# Patient Record
Sex: Male | Born: 1950 | Race: Black or African American | Hispanic: No | Smoking: Current every day smoker
Health system: Southern US, Community
[De-identification: ages and names within clinical notes are randomized; demographics above are authoritative.]

## PROBLEM LIST (undated history)

## (undated) DIAGNOSIS — I82409 Acute embolism and thrombosis of unspecified deep veins of unspecified lower extremity: Secondary | ICD-10-CM

## (undated) DIAGNOSIS — M109 Gout, unspecified: Secondary | ICD-10-CM

## (undated) DIAGNOSIS — I10 Essential (primary) hypertension: Secondary | ICD-10-CM

## (undated) HISTORY — DX: Gout, unspecified: M10.9

## (undated) HISTORY — DX: Essential (primary) hypertension: I10

## (undated) HISTORY — DX: Acute embolism and thrombosis of unspecified deep veins of unspecified lower extremity: I82.409

---

## 1978-06-26 HISTORY — PX: APPENDECTOMY: SHX54

## 2010-07-04 DIAGNOSIS — I80202 Phlebitis and thrombophlebitis of unspecified deep vessels of left lower extremity: Secondary | ICD-10-CM | POA: Insufficient documentation

## 2010-07-05 ENCOUNTER — Ambulatory Visit (HOSPITAL_COMMUNITY)
Admission: RE | Admit: 2010-07-05 | Discharge: 2010-07-05 | Payer: Self-pay | Source: Home / Self Care | Attending: Internal Medicine | Admitting: Internal Medicine

## 2010-07-05 ENCOUNTER — Inpatient Hospital Stay (HOSPITAL_COMMUNITY)
Admission: AD | Admit: 2010-07-05 | Discharge: 2010-07-07 | Payer: Self-pay | Source: Home / Self Care | Attending: Internal Medicine | Admitting: Internal Medicine

## 2010-07-11 LAB — CBC
HCT: 42.7 % (ref 39.0–52.0)
HCT: 40.6 % (ref 39.0–52.0)
HCT: 39.9 % (ref 39.0–52.0)
Hemoglobin: 15.2 g/dL (ref 13.0–17.0)
Hemoglobin: 14.5 g/dL (ref 13.0–17.0)
Hemoglobin: 14 g/dL (ref 13.0–17.0)
MCH: 34.2 pg — ABNORMAL HIGH (ref 26.0–34.0)
MCH: 34.3 pg — ABNORMAL HIGH (ref 26.0–34.0)
MCH: 33.7 pg (ref 26.0–34.0)
MCHC: 35.6 g/dL (ref 30.0–36.0)
MCHC: 35.7 g/dL (ref 30.0–36.0)
MCHC: 35.1 g/dL (ref 30.0–36.0)
MCV: 96 fL (ref 78.0–100.0)
MCV: 96 fL (ref 78.0–100.0)
Platelets: 187 10*3/uL (ref 150–400)
Platelets: 208 10*3/uL (ref 150–400)
RBC: 4.45 MIL/uL (ref 4.22–5.81)
RBC: 4.23 MIL/uL (ref 4.22–5.81)
RBC: 4.16 MIL/uL — ABNORMAL LOW (ref 4.22–5.81)
RDW: 13.8 % (ref 11.5–15.5)
RDW: 13.7 % (ref 11.5–15.5)
RDW: 13.4 % (ref 11.5–15.5)
WBC: 5.3 10*3/uL (ref 4.0–10.5)
WBC: 4.3 10*3/uL (ref 4.0–10.5)

## 2010-07-11 LAB — COMPREHENSIVE METABOLIC PANEL
ALT: 19 U/L (ref 0–53)
AST: 42 U/L — ABNORMAL HIGH (ref 0–37)
Albumin: 3.8 g/dL (ref 3.5–5.2)
Alkaline Phosphatase: 67 U/L (ref 39–117)
BUN: 12 mg/dL (ref 6–23)
CO2: 26 mEq/L (ref 19–32)
Calcium: 10.1 mg/dL (ref 8.4–10.5)
Chloride: 104 mEq/L (ref 96–112)
Creatinine, Ser: 1.26 mg/dL (ref 0.4–1.5)
GFR calc Af Amer: >60 mL/min (ref >60)
GFR calc non Af Amer: 59 mL/min — ABNORMAL LOW (ref >60)
Glucose, Bld: 106 mg/dL — ABNORMAL HIGH (ref 70–99)
Potassium: 3.2 mEq/L — ABNORMAL LOW (ref 3.5–5.1)
Sodium: 139 mEq/L (ref 135–145)
Total Bilirubin: 0.6 mg/dL (ref 0.3–1.2)
Total Protein: 7.4 g/dL (ref 6.0–8.3)

## 2010-07-11 LAB — URINALYSIS, ROUTINE W REFLEX MICROSCOPIC
Bilirubin Urine: NEGATIVE
Hgb urine dipstick: NEGATIVE
Ketones, ur: NEGATIVE mg/dL
Nitrite: NEGATIVE
Protein, ur: NEGATIVE mg/dL
Specific Gravity, Urine: 1.015 (ref 1.005–1.030)
Urine Glucose, Fasting: NEGATIVE mg/dL
Urobilinogen, UA: 0.2 mg/dL (ref 0.0–1.0)
pH: 6.5 (ref 5.0–8.0)

## 2010-07-11 LAB — DIFFERENTIAL
Basophils Absolute: 0 10*3/uL (ref 0.0–0.1)
Basophils Absolute: 0 10*3/uL (ref 0.0–0.1)
Basophils Absolute: 0 10*3/uL (ref 0.0–0.1)
Basophils Relative: 1 % (ref 0–1)
Basophils Relative: 1 % (ref 0–1)
Basophils Relative: 1 % (ref 0–1)
Eosinophils Absolute: 0.2 10*3/uL (ref 0.0–0.7)
Eosinophils Absolute: 0.3 10*3/uL (ref 0.0–0.7)
Eosinophils Absolute: 0.2 10*3/uL (ref 0.0–0.7)
Eosinophils Relative: 4 % (ref 0–5)
Eosinophils Relative: 6 % — ABNORMAL HIGH (ref 0–5)
Eosinophils Relative: 6 % — ABNORMAL HIGH (ref 0–5)
Lymphocytes Relative: 21 % (ref 12–46)
Lymphocytes Relative: 38 % (ref 12–46)
Lymphocytes Relative: 38 % (ref 12–46)
Lymphs Abs: 1.1 10*3/uL (ref 0.7–4.0)
Lymphs Abs: 1.6 10*3/uL (ref 0.7–4.0)
Lymphs Abs: 1.4 10*3/uL (ref 0.7–4.0)
Monocytes Absolute: 0.5 10*3/uL (ref 0.1–1.0)
Monocytes Absolute: 0.4 10*3/uL (ref 0.1–1.0)
Monocytes Absolute: 0.5 10*3/uL (ref 0.1–1.0)
Monocytes Relative: 9 % (ref 3–12)
Monocytes Relative: 10 % (ref 3–12)
Monocytes Relative: 14 % — ABNORMAL HIGH (ref 3–12)
Neutro Abs: 3.4 10*3/uL (ref 1.7–7.7)
Neutro Abs: 2 10*3/uL (ref 1.7–7.7)
Neutro Abs: 1.6 10*3/uL — ABNORMAL LOW (ref 1.7–7.7)
Neutrophils Relative %: 65 % (ref 43–77)
Neutrophils Relative %: 46 % (ref 43–77)
Neutrophils Relative %: 42 % — ABNORMAL LOW (ref 43–77)

## 2010-07-11 LAB — PROTIME-INR
INR: 0.97 (ref 0.00–1.49)
INR: 1.01 (ref 0.00–1.49)
INR: 1 (ref 0.00–1.49)
Prothrombin Time: 13.1 seconds (ref 11.6–15.2)
Prothrombin Time: 13.5 seconds (ref 11.6–15.2)
Prothrombin Time: 13.4 seconds (ref 11.6–15.2)

## 2010-07-11 LAB — HEPARIN LEVEL (UNFRACTIONATED)
Heparin Unfractionated: 0.25 IU/mL — ABNORMAL LOW (ref 0.30–0.70)
Heparin Unfractionated: 0.28 IU/mL — ABNORMAL LOW (ref 0.30–0.70)

## 2010-07-18 NOTE — H&P (Signed)
  [name], Brian Heath                  ACCOUNT NO.:  [id number]  MEDICAL RECORD NO.:  [id number]          PATIENT TYPE:  INP  LOCATION:  A339                          FACILITY:  [facility]  PHYSICIAN:  Mats Jeanlouis D. Felecia Shelling, MD   DATE OF BIRTH:  1950/09/03  DATE OF ADMISSION:  07/05/2010 DATE OF DISCHARGE:  LH                             HISTORY & PHYSICAL   CHIEF COMPLAINT:  Swelling of the left leg.  HISTORY OF PRESENT ILLNESS:  This is a 60 years old male patient with history of hypertension and gouty arthritis, came to the office with above complaints.  The patient has pain in his left leg and the area was swelling progressively.  He has also some itching and dark skin pigmentation.  The patient was seen in the office and ultrasound of the left leg was done.  The ultrasound showed acute deep venous thrombosis. The patient was then admitted directly for anticoagulation and further treatment.  REVIEW OF SYSTEMS:  The patient has no fever, chills, cough, chest pain, shortness of breath, nausea, vomiting, abdominal pain, dysuria, urgency or frequency of urination.  PAST MEDICAL HISTORY: 1. Hypertension. 2. Gouty arthritis. 3. Eczema.  CURRENT MEDICATIONS: 1. Triamcinolone 0.1% b.i.d. 2. Azor 10/20 one tablet daily. 3. Allopurinol 300 mg p.o. daily.  SOCIAL HISTORY:  The patient is married.  He is currently unemployed. No history of recent alcohol, tobacco, or substance abuse.  FAMILY HISTORY:  Both of his parents are diseased.  No history of known illness in the family.  PHYSICAL EXAMINATION:  GENERAL:  The patient is alert awake and resting comfortably. VITAL SIGNS:  Blood pressure 111/75, pulse 61, respiratory rate 22, temperature 98.2 degrees Fahrenheit. HEENT: Pupils are equal and reactive. NECK:  Supple. CHEST:  Clear lung fields with good air entry. CARDIOVASCULAR SYSTEM:  First and second heart sound heard.  No murmur, no gallop. ABDOMEN:  Soft and lax.  Bowel sound is  positive.  No mass or organomegaly. EXTREMITIES:  The patient has a diffuse mildly tender right leg swelling.  There is dark pigmentation with scratch mark.  LABORATORY DATA:  CBC, WBC 7.3, hemoglobin 14.5, hematocrit 40.6, and platelet 208, and sodium 139, potassium 3.2, chloride 104, carbon dioxide 26, BUN 12, creatinine 1.26, and calcium 10.1.  ASSESSMENT: 1. Acute deep venous thrombosis of the left lower extremity. 2. Hypertension. 3. Gouty arthritis. 4. Eczema.  PLAN:  We will continue patient on combination of Coumadin and heparin until patient is fully anticoagulated.  We will continue his regular medications.  Once patient is fully anticoagulated,we will continue patient on Coumadin for 3-6 months.     Raevon Broom D. Felecia Shelling, MD     TDF/MEDQ  D:  07/06/2010  T:  07/06/2010  Job:  [id number]  Electronically Signed by Avon Gully MD on 07/18/2010 08:34:36 AM

## 2010-07-18 NOTE — Discharge Summary (Signed)
  NAMETydarius, Brian Heath                  ACCOUNT NO.:  [id number]  MEDICAL RECORD NO.:  [id number]          PATIENT TYPE:  INP  LOCATION:  A339                          FACILITY:  [facility]  PHYSICIAN:  Antonisha Waskey D. Felecia Shelling, MD   DATE OF BIRTH:  02/23/1951  DATE OF ADMISSION:  07/05/2010 DATE OF DISCHARGE:  01/12/2012LH                              DISCHARGE SUMMARY   DISCHARGE DIAGNOSES: 1. Deep venous thrombosis of the left leg. 2. Hypertension. 3. Gouty arthritis. 4. Eczema of the lower extremity.  DISCHARGE MEDICATIONS: 1. Lovenox 80 mg subcu q.12 h. 2. Coumadin 5 mg p.o. daily. 3. Allopurinol 1 tablet p.o. daily. 4. Azor 5/40 one tablet daily. 5. Hydrochlorothiazide 25 mg p.o. daily.  DISPOSITION:  The patient will be discharged to home in stable condition.  DISCHARGE INSTRUCTIONS:  The patient will continue Coumadin and Lovenox until his INR is between 2 and 3.  His PT/INR will be monitored by Home Health.  HOSPITAL COURSE:  This is a 60 year old male patient with history of multiple medical illnesses including hypertension, gouty arthritis and eczema, who was admitted due to swelling of the left leg.  His venous ultrasound showed acute deep venous thrombosis.  The patient was admitted and was started initially on heparin drip and Coumadin.  His heparin was switched to Lovenox.  Arrangement is made with the drug company to get free Lovenox since the patient has no insurance.  He will be discharged to home on Lovenox and Coumadin.  Home Health will be arranged for monitoring his PT/INR.  On discharge, his PT is 30.4, INR is 1.0.  CBC, hemoglobin 14, hematocrit 34.8, and platelets 197.  The patient overall has improved and he is stable to be discharged home.     Avry Roedl D. Felecia Shelling, MD    TDF/MEDQ  D:  07/07/2010  T:  07/07/2010  Job:  [id number]  Electronically Signed by Avon Gully MD on 07/18/2010 08:34:33 AM

## 2010-08-08 ENCOUNTER — Encounter (INDEPENDENT_AMBULATORY_CARE_PROVIDER_SITE_OTHER): Payer: Self-pay

## 2010-08-08 ENCOUNTER — Encounter: Payer: Self-pay | Admitting: Cardiology

## 2010-08-08 DIAGNOSIS — I80299 Phlebitis and thrombophlebitis of other deep vessels of unspecified lower extremity: Secondary | ICD-10-CM

## 2010-08-08 DIAGNOSIS — Z7901 Long term (current) use of anticoagulants: Secondary | ICD-10-CM

## 2010-08-08 LAB — CONVERTED CEMR LAB: POC INR: 3.4

## 2010-08-17 NOTE — Medication Information (Signed)
Summary: **per Dr.Fanta for DVT / last PT-24.0 and INR-2.9 / tg  Anticoagulant Therapy  Managed by: Vashti Hey, RN PCP: Bari Edward MD: Dietrich Pates MD, Molly Maduro Indication 1: DVT Lab Used: [facility] [place] Site: [place] INR POC 3.4  Dietary changes: no    Health status changes: no    Bleeding/hemorrhagic complications: no    Recent/future hospitalizations: yes       Details: DVT Lt leg 1 /12     Any changes in medication regimen? yes       Details: Coumadin had been managed by Dr Felecia Shelling but has been referred to [place] now  Recent/future dental: no  Any missed doses?: no       Is patient compliant with meds? yes       Anticoagulation Management History:      The patient is taking warfarin and comes in today for a routine follow up visit.  The indication for anticoagulation is DVT.  Negative risk factors for bleeding include an age less than 75 years old, no history of CVA/TIA, no history of GI bleeding, and absence of serious comorbidities.  The bleeding index is 'low risk'.  Positive CHADS2 values include History of HTN.  Negative CHADS2 values include History of CHF, Age > 45 years old, History of Diabetes, and Prior Stroke/CVA/TIA.  The start date was 07/05/2010.  Anticoagulation responsible provider: Dietrich Pates MD, Molly Maduro.  INR POC: 3.4.  Cuvette Lot#: [id number].    Anticoagulation Management Assessment/Plan:      The target INR is 2.0-3.0.  The next INR is due 08/22/2010.  Anticoagulation instructions were given to patient.  Results were reviewed/authorized by Vashti Hey, RN.  He was notified by Vashti Hey RN.        Coagulation management information includes: 08/08/10  Previously managed by Dr Felecia Shelling.  On coumadin 10mg  once daily x 3 wks.  Current Anticoagulation Instructions: INR 3.4 Decrease coumadin to 10mg  once daily except 5mg  on Mondays and Thursdays

## 2010-08-22 ENCOUNTER — Encounter: Payer: Self-pay | Admitting: Cardiology

## 2010-08-22 ENCOUNTER — Encounter (INDEPENDENT_AMBULATORY_CARE_PROVIDER_SITE_OTHER): Payer: Self-pay

## 2010-08-22 DIAGNOSIS — Z7901 Long term (current) use of anticoagulants: Secondary | ICD-10-CM

## 2010-08-22 DIAGNOSIS — I80299 Phlebitis and thrombophlebitis of other deep vessels of unspecified lower extremity: Secondary | ICD-10-CM

## 2010-08-22 LAB — CONVERTED CEMR LAB: POC INR: 3.2

## 2010-09-01 NOTE — Medication Information (Signed)
Summary: ccr-lr  Anticoagulant Therapy  Managed by: Vashti Hey, RN PCP: Bari Edward MD: Diona Browner MD, Remi Deter Indication 1: DVT Lab Used: [facility] [place] Site: [place] INR POC 3.2  Dietary changes: no    Health status changes: no    Bleeding/hemorrhagic complications: no    Recent/future hospitalizations: no    Any changes in medication regimen? no    Recent/future dental: no  Any missed doses?: no       Is patient compliant with meds? yes       Anticoagulation Management History:      The patient is taking warfarin and comes in today for a routine follow up visit.  Anticoagulation is being administered due to DVT.  Negative risk factors for bleeding include an age less than 1 years old, no history of CVA/TIA, no history of GI bleeding, and absence of serious comorbidities.  The bleeding index is 'low risk'.  Positive CHADS2 values include History of HTN.  Negative CHADS2 values include History of CHF, Age > 23 years old, History of Diabetes, and Prior Stroke/CVA/TIA.  The start date was 07/05/2010.  Anticoagulation responsible provider: Diona Browner MD, Remi Deter.  INR POC: 3.2.  Cuvette Lot#: [id number].    Anticoagulation Management Assessment/Plan:      The target INR is 2.0-3.0.  The next INR is due 09/12/2010.  Anticoagulation instructions were given to patient.  Results were reviewed/authorized by Vashti Hey, RN.  He was notified by Vashti Hey RN.         Prior Anticoagulation Instructions: INR 3.4 Decrease coumadin to 10mg  once daily except 5mg  on Mondays and Thursdays  Current Anticoagulation Instructions: INR 3.2 Decrease coumadin to 10mg  once daily except 5mg  on Mondays, Wednesdays and Fridays

## 2010-09-12 ENCOUNTER — Encounter (INDEPENDENT_AMBULATORY_CARE_PROVIDER_SITE_OTHER): Payer: Self-pay

## 2010-09-12 ENCOUNTER — Encounter: Payer: Self-pay | Admitting: Cardiology

## 2010-09-12 DIAGNOSIS — Z7901 Long term (current) use of anticoagulants: Secondary | ICD-10-CM

## 2010-09-12 DIAGNOSIS — I80299 Phlebitis and thrombophlebitis of other deep vessels of unspecified lower extremity: Secondary | ICD-10-CM

## 2010-09-12 LAB — CONVERTED CEMR LAB: POC INR: 2.9

## 2010-09-22 NOTE — Medication Information (Signed)
Summary: ccr-lr  Anticoagulant Therapy  Managed by: Brian Hey, Brian Heath PCP: Bari Edward MD: Dietrich Pates MD, Molly Maduro Indication 1: DVT Lab Used: [facility] [place] Site: [place] INR POC 2.9  Dietary changes: no    Health status changes: no    Bleeding/hemorrhagic complications: no    Recent/future hospitalizations: no    Any changes in medication regimen? no    Recent/future dental: no  Any missed doses?: yes     Details: missed dose yesterday  Is patient compliant with meds? yes       Anticoagulation Management History:      The patient is taking warfarin and comes in today for a routine follow up visit.  Anticoagulation is being administered due to DVT.  Negative risk factors for bleeding include an age less than 27 years old, no history of CVA/TIA, no history of GI bleeding, and absence of serious comorbidities.  The bleeding index is 'low risk'.  Positive CHADS2 values include History of HTN.  Negative CHADS2 values include History of CHF, Age > 41 years old, History of Diabetes, and Prior Stroke/CVA/TIA.  The start date was 07/05/2010.  Anticoagulation responsible provider: Dietrich Pates MD, Molly Maduro.  INR POC: 2.9.  Cuvette Lot#: [id number].    Anticoagulation Management Assessment/Plan:      The target INR is 2.0-3.0.  The next INR is due 09/29/2010.  Anticoagulation instructions were given to patient.  Results were reviewed/authorized by Brian Hey, Brian Heath.  He was notified by Brian Hey Brian Heath.         Prior Anticoagulation Instructions: INR 3.2 Decrease coumadin to 10mg  once daily except 5mg  on Mondays, Wednesdays and Fridays  Current Anticoagulation Instructions: INR 2.9 Continue coumadin 10mg  once daily except 5mg  on Mondays, Wednesdays and Fridays

## 2010-09-29 ENCOUNTER — Encounter: Payer: Self-pay | Admitting: Cardiology

## 2010-09-29 ENCOUNTER — Ambulatory Visit (INDEPENDENT_AMBULATORY_CARE_PROVIDER_SITE_OTHER): Payer: Self-pay | Admitting: *Deleted

## 2010-09-29 DIAGNOSIS — Z7901 Long term (current) use of anticoagulants: Secondary | ICD-10-CM

## 2010-09-29 DIAGNOSIS — I82409 Acute embolism and thrombosis of unspecified deep veins of unspecified lower extremity: Secondary | ICD-10-CM

## 2010-09-29 LAB — POCT INR: INR: 3.2

## 2010-10-27 ENCOUNTER — Ambulatory Visit (INDEPENDENT_AMBULATORY_CARE_PROVIDER_SITE_OTHER): Payer: Self-pay | Admitting: *Deleted

## 2010-10-27 DIAGNOSIS — I82409 Acute embolism and thrombosis of unspecified deep veins of unspecified lower extremity: Secondary | ICD-10-CM

## 2010-10-27 DIAGNOSIS — Z7901 Long term (current) use of anticoagulants: Secondary | ICD-10-CM

## 2010-10-27 LAB — POCT INR: INR: 1.7

## 2010-11-17 ENCOUNTER — Ambulatory Visit (INDEPENDENT_AMBULATORY_CARE_PROVIDER_SITE_OTHER): Payer: Self-pay | Admitting: *Deleted

## 2010-11-17 DIAGNOSIS — Z7901 Long term (current) use of anticoagulants: Secondary | ICD-10-CM

## 2010-11-17 DIAGNOSIS — I82409 Acute embolism and thrombosis of unspecified deep veins of unspecified lower extremity: Secondary | ICD-10-CM

## 2010-11-17 LAB — POCT INR: INR: 2.7

## 2010-12-14 ENCOUNTER — Ambulatory Visit (INDEPENDENT_AMBULATORY_CARE_PROVIDER_SITE_OTHER): Payer: Self-pay | Admitting: *Deleted

## 2010-12-14 DIAGNOSIS — I82409 Acute embolism and thrombosis of unspecified deep veins of unspecified lower extremity: Secondary | ICD-10-CM

## 2010-12-14 DIAGNOSIS — Z7901 Long term (current) use of anticoagulants: Secondary | ICD-10-CM

## 2010-12-14 LAB — POCT INR: INR: 3.3

## 2010-12-23 ENCOUNTER — Other Ambulatory Visit: Payer: Self-pay | Admitting: *Deleted

## 2010-12-23 ENCOUNTER — Telehealth: Payer: Self-pay | Admitting: Cardiology

## 2010-12-23 DIAGNOSIS — I82409 Acute embolism and thrombosis of unspecified deep veins of unspecified lower extremity: Secondary | ICD-10-CM

## 2010-12-23 DIAGNOSIS — Z7901 Long term (current) use of anticoagulants: Secondary | ICD-10-CM

## 2010-12-23 MED ORDER — WARFARIN SODIUM 10 MG PO TABS: ORAL_TABLET | ORAL | Status: DC

## 2010-12-23 NOTE — Telephone Encounter (Signed)
Needs Warfarin 10mg  sent in to [facility] in [place] / tg

## 2011-01-11 ENCOUNTER — Ambulatory Visit (INDEPENDENT_AMBULATORY_CARE_PROVIDER_SITE_OTHER): Payer: Self-pay | Admitting: *Deleted

## 2011-01-11 DIAGNOSIS — Z7901 Long term (current) use of anticoagulants: Secondary | ICD-10-CM

## 2011-01-11 DIAGNOSIS — I82409 Acute embolism and thrombosis of unspecified deep veins of unspecified lower extremity: Secondary | ICD-10-CM

## 2011-01-11 LAB — POCT INR: INR: 3.1

## 2011-02-08 ENCOUNTER — Ambulatory Visit (INDEPENDENT_AMBULATORY_CARE_PROVIDER_SITE_OTHER): Payer: Self-pay | Admitting: *Deleted

## 2011-02-08 DIAGNOSIS — I82409 Acute embolism and thrombosis of unspecified deep veins of unspecified lower extremity: Secondary | ICD-10-CM

## 2011-02-08 DIAGNOSIS — Z7901 Long term (current) use of anticoagulants: Secondary | ICD-10-CM

## 2011-02-08 LAB — POCT INR: INR: 4.4

## 2011-03-01 ENCOUNTER — Ambulatory Visit (INDEPENDENT_AMBULATORY_CARE_PROVIDER_SITE_OTHER): Payer: Self-pay | Admitting: *Deleted

## 2011-03-01 DIAGNOSIS — Z7901 Long term (current) use of anticoagulants: Secondary | ICD-10-CM

## 2011-03-01 DIAGNOSIS — I82409 Acute embolism and thrombosis of unspecified deep veins of unspecified lower extremity: Secondary | ICD-10-CM

## 2011-03-01 LAB — POCT INR: INR: 3.3

## 2011-03-22 ENCOUNTER — Ambulatory Visit (INDEPENDENT_AMBULATORY_CARE_PROVIDER_SITE_OTHER): Payer: Self-pay | Admitting: *Deleted

## 2011-03-22 DIAGNOSIS — Z7901 Long term (current) use of anticoagulants: Secondary | ICD-10-CM

## 2011-03-22 DIAGNOSIS — I82409 Acute embolism and thrombosis of unspecified deep veins of unspecified lower extremity: Secondary | ICD-10-CM

## 2011-03-22 LAB — POCT INR: INR: 2.5

## 2011-04-19 ENCOUNTER — Ambulatory Visit (INDEPENDENT_AMBULATORY_CARE_PROVIDER_SITE_OTHER): Payer: Self-pay | Admitting: *Deleted

## 2011-04-19 DIAGNOSIS — Z7901 Long term (current) use of anticoagulants: Secondary | ICD-10-CM

## 2011-04-19 DIAGNOSIS — I82409 Acute embolism and thrombosis of unspecified deep veins of unspecified lower extremity: Secondary | ICD-10-CM

## 2011-04-19 LAB — POCT INR: INR: 2.8

## 2011-05-17 ENCOUNTER — Ambulatory Visit (INDEPENDENT_AMBULATORY_CARE_PROVIDER_SITE_OTHER): Payer: Self-pay | Admitting: *Deleted

## 2011-05-17 DIAGNOSIS — I82409 Acute embolism and thrombosis of unspecified deep veins of unspecified lower extremity: Secondary | ICD-10-CM

## 2011-05-17 DIAGNOSIS — Z7901 Long term (current) use of anticoagulants: Secondary | ICD-10-CM

## 2011-05-17 LAB — POCT INR: INR: 3

## 2011-06-13 ENCOUNTER — Other Ambulatory Visit: Payer: Self-pay | Admitting: *Deleted

## 2011-06-13 DIAGNOSIS — Z7901 Long term (current) use of anticoagulants: Secondary | ICD-10-CM

## 2011-06-13 DIAGNOSIS — I82409 Acute embolism and thrombosis of unspecified deep veins of unspecified lower extremity: Secondary | ICD-10-CM

## 2011-06-13 MED ORDER — WARFARIN SODIUM 10 MG PO TABS: ORAL_TABLET | ORAL | Status: DC

## 2011-06-13 NOTE — Telephone Encounter (Signed)
Patient states has taken last dose today of coumadin.

## 2011-06-14 ENCOUNTER — Ambulatory Visit (INDEPENDENT_AMBULATORY_CARE_PROVIDER_SITE_OTHER): Payer: Self-pay | Admitting: *Deleted

## 2011-06-14 DIAGNOSIS — I82409 Acute embolism and thrombosis of unspecified deep veins of unspecified lower extremity: Secondary | ICD-10-CM

## 2011-06-14 DIAGNOSIS — Z7901 Long term (current) use of anticoagulants: Secondary | ICD-10-CM

## 2011-06-14 LAB — POCT INR: INR: 2.6

## 2011-07-26 ENCOUNTER — Ambulatory Visit (INDEPENDENT_AMBULATORY_CARE_PROVIDER_SITE_OTHER): Payer: Self-pay | Admitting: *Deleted

## 2011-07-26 ENCOUNTER — Encounter: Payer: Self-pay | Admitting: *Deleted

## 2011-07-26 DIAGNOSIS — I82409 Acute embolism and thrombosis of unspecified deep veins of unspecified lower extremity: Secondary | ICD-10-CM

## 2011-07-26 DIAGNOSIS — Z7901 Long term (current) use of anticoagulants: Secondary | ICD-10-CM

## 2011-07-26 LAB — POCT INR: INR: 3.5

## 2011-08-30 ENCOUNTER — Ambulatory Visit (INDEPENDENT_AMBULATORY_CARE_PROVIDER_SITE_OTHER): Payer: Self-pay | Admitting: *Deleted

## 2011-08-30 DIAGNOSIS — I82409 Acute embolism and thrombosis of unspecified deep veins of unspecified lower extremity: Secondary | ICD-10-CM

## 2011-08-30 DIAGNOSIS — Z7901 Long term (current) use of anticoagulants: Secondary | ICD-10-CM

## 2011-10-11 ENCOUNTER — Ambulatory Visit (INDEPENDENT_AMBULATORY_CARE_PROVIDER_SITE_OTHER): Payer: Self-pay | Admitting: *Deleted

## 2011-10-11 DIAGNOSIS — I82409 Acute embolism and thrombosis of unspecified deep veins of unspecified lower extremity: Secondary | ICD-10-CM

## 2011-10-11 DIAGNOSIS — Z7901 Long term (current) use of anticoagulants: Secondary | ICD-10-CM

## 2011-11-22 ENCOUNTER — Ambulatory Visit (INDEPENDENT_AMBULATORY_CARE_PROVIDER_SITE_OTHER): Payer: Self-pay | Admitting: *Deleted

## 2011-11-22 DIAGNOSIS — Z7901 Long term (current) use of anticoagulants: Secondary | ICD-10-CM

## 2011-11-22 DIAGNOSIS — I82409 Acute embolism and thrombosis of unspecified deep veins of unspecified lower extremity: Secondary | ICD-10-CM

## 2011-11-22 LAB — POCT INR: INR: 3.6

## 2011-12-11 ENCOUNTER — Other Ambulatory Visit: Payer: Self-pay | Admitting: Cardiology

## 2011-12-11 DIAGNOSIS — I82409 Acute embolism and thrombosis of unspecified deep veins of unspecified lower extremity: Secondary | ICD-10-CM

## 2011-12-11 DIAGNOSIS — Z7901 Long term (current) use of anticoagulants: Secondary | ICD-10-CM

## 2011-12-11 MED ORDER — WARFARIN SODIUM 10 MG PO TABS: ORAL_TABLET | ORAL | Status: DC

## 2011-12-20 ENCOUNTER — Ambulatory Visit (INDEPENDENT_AMBULATORY_CARE_PROVIDER_SITE_OTHER): Payer: Self-pay | Admitting: *Deleted

## 2011-12-20 DIAGNOSIS — Z7901 Long term (current) use of anticoagulants: Secondary | ICD-10-CM

## 2011-12-20 DIAGNOSIS — I82409 Acute embolism and thrombosis of unspecified deep veins of unspecified lower extremity: Secondary | ICD-10-CM

## 2012-01-17 ENCOUNTER — Ambulatory Visit (INDEPENDENT_AMBULATORY_CARE_PROVIDER_SITE_OTHER): Payer: Self-pay | Admitting: *Deleted

## 2012-01-17 DIAGNOSIS — Z7901 Long term (current) use of anticoagulants: Secondary | ICD-10-CM

## 2012-01-17 DIAGNOSIS — I82409 Acute embolism and thrombosis of unspecified deep veins of unspecified lower extremity: Secondary | ICD-10-CM

## 2012-02-14 ENCOUNTER — Ambulatory Visit (INDEPENDENT_AMBULATORY_CARE_PROVIDER_SITE_OTHER): Payer: Self-pay | Admitting: *Deleted

## 2012-02-14 DIAGNOSIS — Z7901 Long term (current) use of anticoagulants: Secondary | ICD-10-CM

## 2012-02-14 DIAGNOSIS — I82409 Acute embolism and thrombosis of unspecified deep veins of unspecified lower extremity: Secondary | ICD-10-CM

## 2012-02-14 LAB — POCT INR: INR: 2.9

## 2012-03-27 ENCOUNTER — Ambulatory Visit (INDEPENDENT_AMBULATORY_CARE_PROVIDER_SITE_OTHER): Payer: Self-pay | Admitting: *Deleted

## 2012-03-27 DIAGNOSIS — I82409 Acute embolism and thrombosis of unspecified deep veins of unspecified lower extremity: Secondary | ICD-10-CM

## 2012-03-27 DIAGNOSIS — Z7901 Long term (current) use of anticoagulants: Secondary | ICD-10-CM

## 2012-03-27 LAB — POCT INR: INR: 3.2

## 2012-05-01 ENCOUNTER — Ambulatory Visit (INDEPENDENT_AMBULATORY_CARE_PROVIDER_SITE_OTHER): Payer: Self-pay | Admitting: *Deleted

## 2012-05-01 DIAGNOSIS — Z7901 Long term (current) use of anticoagulants: Secondary | ICD-10-CM

## 2012-05-01 DIAGNOSIS — I82409 Acute embolism and thrombosis of unspecified deep veins of unspecified lower extremity: Secondary | ICD-10-CM

## 2012-05-01 LAB — POCT INR: INR: 2.2

## 2012-05-29 ENCOUNTER — Ambulatory Visit (INDEPENDENT_AMBULATORY_CARE_PROVIDER_SITE_OTHER): Payer: Self-pay | Admitting: *Deleted

## 2012-05-29 DIAGNOSIS — I82409 Acute embolism and thrombosis of unspecified deep veins of unspecified lower extremity: Secondary | ICD-10-CM

## 2012-05-29 DIAGNOSIS — Z7901 Long term (current) use of anticoagulants: Secondary | ICD-10-CM

## 2012-05-29 LAB — POCT INR: INR: 1.4

## 2012-06-13 ENCOUNTER — Ambulatory Visit (INDEPENDENT_AMBULATORY_CARE_PROVIDER_SITE_OTHER): Payer: Self-pay | Admitting: *Deleted

## 2012-06-13 DIAGNOSIS — I82409 Acute embolism and thrombosis of unspecified deep veins of unspecified lower extremity: Secondary | ICD-10-CM

## 2012-06-13 DIAGNOSIS — Z7901 Long term (current) use of anticoagulants: Secondary | ICD-10-CM

## 2012-06-13 LAB — POCT INR: INR: 2.2

## 2012-06-27 ENCOUNTER — Telehealth: Payer: Self-pay | Admitting: *Deleted

## 2012-06-27 DIAGNOSIS — Z7901 Long term (current) use of anticoagulants: Secondary | ICD-10-CM

## 2012-06-27 DIAGNOSIS — I82409 Acute embolism and thrombosis of unspecified deep veins of unspecified lower extremity: Secondary | ICD-10-CM

## 2012-06-27 MED ORDER — WARFARIN SODIUM 10 MG PO TABS: ORAL_TABLET | ORAL | Status: DC

## 2012-06-27 NOTE — Telephone Encounter (Signed)
Needs refill on Warfarin called in. / tgs

## 2012-06-27 NOTE — Telephone Encounter (Signed)
Rx sent in

## 2012-07-11 ENCOUNTER — Ambulatory Visit (INDEPENDENT_AMBULATORY_CARE_PROVIDER_SITE_OTHER): Payer: Self-pay | Admitting: *Deleted

## 2012-07-11 DIAGNOSIS — Z7901 Long term (current) use of anticoagulants: Secondary | ICD-10-CM

## 2012-07-11 DIAGNOSIS — I82409 Acute embolism and thrombosis of unspecified deep veins of unspecified lower extremity: Secondary | ICD-10-CM

## 2012-08-12 ENCOUNTER — Ambulatory Visit (INDEPENDENT_AMBULATORY_CARE_PROVIDER_SITE_OTHER): Payer: Self-pay | Admitting: *Deleted

## 2012-08-12 DIAGNOSIS — Z7901 Long term (current) use of anticoagulants: Secondary | ICD-10-CM

## 2012-08-12 DIAGNOSIS — I82409 Acute embolism and thrombosis of unspecified deep veins of unspecified lower extremity: Secondary | ICD-10-CM

## 2012-08-12 LAB — POCT INR: INR: 2.2

## 2012-09-11 ENCOUNTER — Encounter: Payer: Self-pay | Admitting: *Deleted

## 2012-09-12 ENCOUNTER — Ambulatory Visit (INDEPENDENT_AMBULATORY_CARE_PROVIDER_SITE_OTHER): Payer: Self-pay | Admitting: Cardiology

## 2012-09-12 ENCOUNTER — Ambulatory Visit (INDEPENDENT_AMBULATORY_CARE_PROVIDER_SITE_OTHER): Payer: Self-pay | Admitting: *Deleted

## 2012-09-12 ENCOUNTER — Encounter: Payer: Self-pay | Admitting: Cardiology

## 2012-09-12 ENCOUNTER — Other Ambulatory Visit: Payer: Self-pay | Admitting: Cardiology

## 2012-09-12 VITALS — BP 114/78 | HR 102 | Ht 68.0 in | Wt 181.0 lb

## 2012-09-12 DIAGNOSIS — I82402 Acute embolism and thrombosis of unspecified deep veins of left lower extremity: Secondary | ICD-10-CM

## 2012-09-12 DIAGNOSIS — I10 Essential (primary) hypertension: Secondary | ICD-10-CM

## 2012-09-12 DIAGNOSIS — I82409 Acute embolism and thrombosis of unspecified deep veins of unspecified lower extremity: Secondary | ICD-10-CM

## 2012-09-12 DIAGNOSIS — I80299 Phlebitis and thrombophlebitis of other deep vessels of unspecified lower extremity: Secondary | ICD-10-CM

## 2012-09-12 DIAGNOSIS — I80202 Phlebitis and thrombophlebitis of unspecified deep vessels of left lower extremity: Secondary | ICD-10-CM

## 2012-09-12 DIAGNOSIS — Z9189 Other specified personal risk factors, not elsewhere classified: Secondary | ICD-10-CM

## 2012-09-12 DIAGNOSIS — Z7901 Long term (current) use of anticoagulants: Secondary | ICD-10-CM

## 2012-09-12 DIAGNOSIS — Z9289 Personal history of other medical treatment: Secondary | ICD-10-CM

## 2012-09-12 DIAGNOSIS — M109 Gout, unspecified: Secondary | ICD-10-CM | POA: Insufficient documentation

## 2012-09-12 LAB — COMPREHENSIVE METABOLIC PANEL
AST: 43 U/L — ABNORMAL HIGH (ref 0–37)
BUN: 19 mg/dL (ref 6–23)
Calcium: 10.3 mg/dL (ref 8.4–10.5)
Chloride: 105 mEq/L (ref 96–112)
Creat: 1.44 mg/dL — ABNORMAL HIGH (ref 0.50–1.35)
Total Bilirubin: 0.6 mg/dL (ref 0.3–1.2)

## 2012-09-12 NOTE — Progress Notes (Signed)
Patient ID: Brian Heath, male   DOB: 1951-06-18, 62 y.o.   MRN: [id number] HPI: Initial Cardiology evaluation for this nice gentleman referred by Brian Gully, MD for continuing management of anticoagulation 2 years following diagnosis of DVT. Mr. Brian Heath presented with swelling and skin eruption of the left leg below the knee in 2012 and was found to have DVT of the popliteal and upper calf veins. He responded well to initial anticoagulation and has been maintained on warfarin without incident since. There is no family history for thrombosis. He denies prolonged immobilization or other precipitating causes for his initial episode.  Office records of Dr. Felecia Heath obtained and reviewed.  Current Outpatient Prescriptions on File Prior to Visit  Medication Sig Dispense Refill  . allopurinol (ZYLOPRIM) 300 MG tablet Take 300 mg by mouth daily.      Marland Kitchen amlodipine-olmesartan (AZOR) 10-20 MG per tablet Take 1 tablet by mouth daily.      . hydrochlorothiazide (HYDRODIURIL) 25 MG tablet Take 12.5 mg by mouth daily.      Marland Kitchen triamcinolone cream (KENALOG) 0.1 % Apply 1 application topically 2 (two) times daily.       No current facility-administered medications on file prior to visit.   No Known Allergies  Past Medical History  Diagnosis Date  . Hypertension   . Gout   . DVT, lower extremity     Left popliteal  . Eczema     Past Surgical History  Procedure Laterality Date  . None      History reviewed. No pertinent family history.  History   Social History  . Marital Status: Married    Spouse Name: N/A    Number of Children: N/A  . Years of Education: N/A   Occupational History  . Not on file.   Social History Main Topics  . Smoking status: Current Every Day Smoker -- 1.00 packs/day for 35 years    Types: Cigars  . Smokeless tobacco: Never Used  . Alcohol Use: Yes     Comment: 1 -2 glasses of Brandy per evening  . Drug Use: Yes    Special: Marijuana     Comment: Occasional  . Sexually  Active: Not on file   Other Topics Concern  . Not on file   Social History Narrative  . No narrative on file    ROS: Patient describes a history of mild depression and anxiety, seasonal allergies, exposure to HIV, pedal edema and joint discomfort secondary to gout.  All other systems reviewed and are negative.  PHYSICAL EXAM: BP 114/78  Pulse 102  Ht 5\' 8"  (1.727 m)  Wt 82.101 kg (181 lb)  BMI 27.53 kg/m2;  Body mass index is 27.53 kg/(m^2).   General-Well-developed; no acute distress Body Habitus-mildly overweight HEENT-[place]/AT; PERRL; EOM intact; bilateral arcus; conjunctiva and lids nl Neck-No JVD; no carotid bruits Endocrine-No thyromegaly Lungs-Clear lung fields; resonant percussion; normal I-to-E ratio Cardiovascular- normal PMI; normal S1 and S2 Abdomen-BS normal; soft and non-tender without masses or organomegaly Musculoskeletal-No deformities, cyanosis or clubbing Neurologic-Nl cranial nerves; symmetric strength and tone Skin- Warm, dry Extremities-Nl distal pulses; no edema  EKG:  Prior tracing obtained from PCP office: Normal sinus rhythm at a rate of 65 bpm; left atrial abnormality; leftward axis; delayed R-wave progression; minor nonspecific T wave abnormality.  [place] Bing, MD 09/12/2012  1:45 PM  ASSESSMENT AND PLAN

## 2012-09-12 NOTE — Assessment & Plan Note (Signed)
Patient does not monitor blood pressure, but values have been acceptable whenever he is seen in a medical office. Control today appears excellent. Current medications will be continued.

## 2012-09-12 NOTE — Assessment & Plan Note (Signed)
Patient has done well over the past 2 years with no evidence for a postphlebitic syndrome and no problems related to anticoagulation. For a single episode of below the knee thrombosis, I would not be inclined to continue anticoagulation indefinitely. Warfarin will be discontinued, a d-dimer level checked thereafter and ultrasound of the lower extremity veins performed in 2 months.

## 2012-09-12 NOTE — Progress Notes (Deleted)
Name: Brian Heath    DOB: 1950-08-04  Age: 62 y.o.  MR#: [id number]       PCP:  Avon Gully, MD      Insurance: Payor:  No coverage found.   CC:   No chief complaint on file.  Medication List   VS Filed Vitals:   09/12/12 1252  BP: 114/78  Pulse: 102  Height: 5\' 8"  (1.727 m)  Weight: 181 lb (82.101 kg)    Weights Current Weight  09/12/12 181 lb (82.101 kg)    Blood Pressure  BP Readings from Last 3 Encounters:  09/12/12 114/78     Admit date:  (Not on file) Last encounter with RMR:  Visit date not found   Allergy Review of patient's allergies indicates no known allergies.  Current Outpatient Prescriptions  Medication Sig Dispense Refill  . allopurinol (ZYLOPRIM) 300 MG tablet Take 300 mg by mouth daily.      Marland Kitchen amlodipine-olmesartan (AZOR) 10-20 MG per tablet Take 1 tablet by mouth daily.      . hydrochlorothiazide (HYDRODIURIL) 25 MG tablet Take 12.5 mg by mouth daily.      Marland Kitchen triamcinolone cream (KENALOG) 0.1 % Apply 1 application topically 2 (two) times daily.      Marland Kitchen warfarin (COUMADIN) 10 MG tablet Take 5 mg by mouth daily. Takes 5 mg QD, except 10 mg on Fridays       No current facility-administered medications for this visit.    Discontinued Meds:   There are no discontinued medications.  Patient Active Problem List  Diagnosis  . DVT (deep venous thrombosis)  . Chronic anticoagulation  . History of diagnostic tests    LABS    Component Value Date/Time   NA 139 07/05/2010 1128   K 3.2* 07/05/2010 1128   CL 104 07/05/2010 1128   CO2 26 07/05/2010 1128   GLUCOSE 106* 07/05/2010 1128   BUN 12 07/05/2010 1128   CREATININE 1.26 07/05/2010 1128   CALCIUM 10.1 07/05/2010 1128   GFRNONAA 59* 07/05/2010 1128   GFRAA  Value: >60        The eGFR has been calculated using the MDRD equation. This calculation has not been validated in all clinical situations. eGFR's persistently <60 mL/min signify possible Chronic Kidney Disease. 07/05/2010 1128   CMP     Component  Value Date/Time   NA 139 07/05/2010 1128   K 3.2* 07/05/2010 1128   CL 104 07/05/2010 1128   CO2 26 07/05/2010 1128   GLUCOSE 106* 07/05/2010 1128   BUN 12 07/05/2010 1128   CREATININE 1.26 07/05/2010 1128   CALCIUM 10.1 07/05/2010 1128   PROT 7.4 07/05/2010 1128   ALBUMIN 3.8 07/05/2010 1128   AST 42* 07/05/2010 1128   ALT 19 07/05/2010 1128   ALKPHOS 67 07/05/2010 1128   BILITOT 0.6 07/05/2010 1128   GFRNONAA 59* 07/05/2010 1128   GFRAA  Value: >60        The eGFR has been calculated using the MDRD equation. This calculation has not been validated in all clinical situations. eGFR's persistently <60 mL/min signify possible Chronic Kidney Disease. 07/05/2010 1128       Component Value Date/Time   WBC 3.8* 07/07/2010 0455   WBC 4.3 07/06/2010 0514   WBC 5.3 07/05/2010 1128   HGB 14.0 07/07/2010 0455   HGB 14.5 07/06/2010 0514   HGB 15.2 07/05/2010 1128   HCT 39.9 07/07/2010 0455   HCT 40.6 07/06/2010 0514   HCT 42.7 07/05/2010 1128  MCV 95.9 07/07/2010 0455   MCV 96.0 07/06/2010 0514   MCV 96.0 07/05/2010 1128    Lipid Panel  No results found for this basename: chol, trig, hdl, cholhdl, vldl, ldlcalc    ABG No results found for this basename: phart, pco2, pco2art, po2, po2art, hco3, tco2, acidbasedef, o2sat     No results found for this basename: TSH   BNP (last 3 results) No results found for this basename: PROBNP,  in the last 8760 hours Cardiac Panel (last 3 results) No results found for this basename: CKTOTAL, CKMB, TROPONINI, RELINDX,  in the last 72 hours  Iron/TIBC/Ferritin No results found for this basename: iron, tibc, ferritin     EKG No orders found for this or any previous visit.   Prior Assessment and Plan Problem List as of 09/12/2012     ICD-9-CM     Cardiology Problems   DVT (deep venous thrombosis)     Other   Chronic anticoagulation   History of diagnostic tests       Imaging: No results found.

## 2012-09-12 NOTE — Patient Instructions (Addendum)
Your physician recommends that you schedule a follow-up appointment in:  3 months  Your physician recommends that you return for lab work in: Today  Your physician has recommended you make the following change in your medication:  1 - STOP Coumadin  Ultrasound in 2 months  Notify us immediately if any leg symptoms   Stools x 3 and return to office

## 2012-09-14 ENCOUNTER — Encounter: Payer: Self-pay | Admitting: Cardiology

## 2012-09-16 ENCOUNTER — Ambulatory Visit (HOSPITAL_COMMUNITY): Payer: Self-pay

## 2012-09-24 ENCOUNTER — Ambulatory Visit (INDEPENDENT_AMBULATORY_CARE_PROVIDER_SITE_OTHER): Payer: Self-pay | Admitting: *Deleted

## 2012-09-24 DIAGNOSIS — Z7901 Long term (current) use of anticoagulants: Secondary | ICD-10-CM

## 2012-09-24 LAB — POC HEMOCCULT BLD/STL (HOME/3-CARD/SCREEN): Fecal Occult Blood, POC: NEGATIVE

## 2012-11-15 ENCOUNTER — Ambulatory Visit (HOSPITAL_COMMUNITY): Payer: Self-pay

## 2012-12-16 ENCOUNTER — Ambulatory Visit (HOSPITAL_COMMUNITY)
Admission: RE | Admit: 2012-12-16 | Discharge: 2012-12-16 | Disposition: A | Discharge status: Home / Self Care | Payer: PRIVATE HEALTH INSURANCE | Source: Ambulatory Visit | Attending: Cardiology | Admitting: Cardiology

## 2012-12-16 ENCOUNTER — Encounter: Payer: Self-pay | Admitting: Cardiology

## 2012-12-16 DIAGNOSIS — I82402 Acute embolism and thrombosis of unspecified deep veins of left lower extremity: Secondary | ICD-10-CM

## 2012-12-16 DIAGNOSIS — I82891 Chronic embolism and thrombosis of other specified veins: Secondary | ICD-10-CM | POA: Insufficient documentation

## 2012-12-17 ENCOUNTER — Ambulatory Visit: Payer: Self-pay | Admitting: Cardiology

## 2013-01-10 ENCOUNTER — Ambulatory Visit: Payer: Self-pay | Admitting: Adult Health

## 2014-02-04 ENCOUNTER — Telehealth: Payer: Self-pay

## 2014-02-04 NOTE — Telephone Encounter (Signed)
Tried to call with no answer  

## 2014-02-06 NOTE — Telephone Encounter (Signed)
Contacted pt and he will call back later.

## 2014-02-26 NOTE — Telephone Encounter (Signed)
See separate triage note.

## 2014-03-04 NOTE — Telephone Encounter (Signed)
Gastroenterology Pre-Procedure Review  Request Date: 03/04/2014 Requesting Physician: Dr. Legrand Rams  PATIENT REVIEW QUESTIONS: The patient responded to the following health history questions as indicated:    THIS IS PT'S FIRST COLONOSCOPY/ HE SAID HE DRINKS ABOUT 1/2 PINT OF BRANDY DAILY/ DOES HE NEED OV FIRST?  1. Diabetes Melitis: no 2. Joint replacements in the past 12 months: no 3. Major health problems in the past 3 months: no 4. Has an artificial valve or MVP: no 5. Has a defibrillator: no 6. Has been advised in past to take antibiotics in advance of a procedure like teeth cleaning: no    MEDICATIONS & ALLERGIES:    Patient reports the following regarding taking any blood thinners:   Plavix? no Aspirin? no Coumadin? no  Patient confirms/reports the following medications:  Current Outpatient Prescriptions  Medication Sig Dispense Refill  . allopurinol (ZYLOPRIM) 300 MG tablet Take 300 mg by mouth daily.      Marland Kitchen amlodipine-olmesartan (AZOR) 10-20 MG per tablet Take 1 tablet by mouth daily.      . hydrochlorothiazide (HYDRODIURIL) 25 MG tablet Take 12.5 mg by mouth daily.      Marland Kitchen triamcinolone cream (KENALOG) 0.1 % Apply 1 application topically 2 (two) times daily.       No current facility-administered medications for this visit.    Patient confirms/reports the following allergies:  No Known Allergies  No orders of the defined types were placed in this encounter.    AUTHORIZATION INFORMATION Primary Insurance:   ID #:  Group #:  Pre-Cert / Auth required: Pre-Cert / Auth #:   Secondary Insurance:   ID #:   Group #:  Pre-Cert / Auth required: Pre-Cert / Auth #:   SCHEDULE INFORMATION: Procedure has been scheduled as follows:  Date: 03/30/2014                   Time: 8:30 AM Location: [facility]  This Gastroenterology Pre-Precedure Review Form is being routed to the following provider(s): Barney Drain, MD

## 2014-03-09 NOTE — Telephone Encounter (Signed)
MOVI PREP SPLIT DOSING- CLEAR LIQUIDS WITH BREAKFAST.  

## 2014-03-10 ENCOUNTER — Other Ambulatory Visit: Payer: Self-pay

## 2014-03-10 DIAGNOSIS — Z1211 Encounter for screening for malignant neoplasm of colon: Secondary | ICD-10-CM

## 2014-03-10 MED ORDER — PEG-KCL-NACL-NASULF-NA ASC-C 100 G PO SOLR: 1.0000 | ORAL | Status: DC

## 2014-03-10 NOTE — Telephone Encounter (Signed)
Rx sent to the pharmacy and instructions mailed to pt.  

## 2014-03-10 NOTE — Addendum Note (Signed)
Addended by: Everardo All on: 03/10/2014 08:20 AM   Modules accepted: Orders

## 2014-03-16 ENCOUNTER — Ambulatory Visit (HOSPITAL_COMMUNITY)
Admission: RE | Admit: 2014-03-16 | Discharge: 2014-03-16 | Disposition: A | Discharge status: Home / Self Care | Payer: No Typology Code available for payment source | Source: Ambulatory Visit | Attending: Internal Medicine | Admitting: Internal Medicine

## 2014-03-16 ENCOUNTER — Other Ambulatory Visit (HOSPITAL_COMMUNITY): Payer: Self-pay | Admitting: Internal Medicine

## 2014-03-16 DIAGNOSIS — F17200 Nicotine dependence, unspecified, uncomplicated: Secondary | ICD-10-CM | POA: Diagnosis not present

## 2014-03-16 DIAGNOSIS — R634 Abnormal weight loss: Secondary | ICD-10-CM | POA: Diagnosis present

## 2014-03-16 DIAGNOSIS — I10 Essential (primary) hypertension: Secondary | ICD-10-CM | POA: Diagnosis not present

## 2014-03-16 DIAGNOSIS — R05 Cough: Secondary | ICD-10-CM | POA: Insufficient documentation

## 2014-03-16 DIAGNOSIS — R059 Cough, unspecified: Secondary | ICD-10-CM | POA: Insufficient documentation

## 2014-03-23 ENCOUNTER — Encounter (HOSPITAL_COMMUNITY): Payer: Self-pay | Admitting: Pharmacy Technician

## 2014-03-24 ENCOUNTER — Other Ambulatory Visit: Payer: Self-pay

## 2014-03-30 ENCOUNTER — Ambulatory Visit (HOSPITAL_COMMUNITY)
Admission: RE | Admit: 2014-03-30 | Discharge: 2014-03-30 | Disposition: A | Discharge status: Home / Self Care | Payer: No Typology Code available for payment source | Source: Ambulatory Visit | Attending: Gastroenterology | Admitting: Gastroenterology

## 2014-03-30 ENCOUNTER — Encounter (HOSPITAL_COMMUNITY)
Admission: RE | Disposition: A | Discharge status: Home / Self Care | Payer: Self-pay | Source: Ambulatory Visit | Attending: Gastroenterology

## 2014-03-30 ENCOUNTER — Encounter (HOSPITAL_COMMUNITY): Payer: Self-pay | Admitting: *Deleted

## 2014-03-30 DIAGNOSIS — Z1211 Encounter for screening for malignant neoplasm of colon: Secondary | ICD-10-CM

## 2014-03-30 DIAGNOSIS — I10 Essential (primary) hypertension: Secondary | ICD-10-CM | POA: Diagnosis not present

## 2014-03-30 DIAGNOSIS — F17210 Nicotine dependence, cigarettes, uncomplicated: Secondary | ICD-10-CM | POA: Diagnosis not present

## 2014-03-30 DIAGNOSIS — Z79899 Other long term (current) drug therapy: Secondary | ICD-10-CM | POA: Diagnosis not present

## 2014-03-30 DIAGNOSIS — K648 Other hemorrhoids: Secondary | ICD-10-CM | POA: Insufficient documentation

## 2014-03-30 DIAGNOSIS — Z86718 Personal history of other venous thrombosis and embolism: Secondary | ICD-10-CM | POA: Insufficient documentation

## 2014-03-30 DIAGNOSIS — K5140 Inflammatory polyps of colon without complications: Secondary | ICD-10-CM

## 2014-03-30 DIAGNOSIS — K5730 Diverticulosis of large intestine without perforation or abscess without bleeding: Secondary | ICD-10-CM | POA: Insufficient documentation

## 2014-03-30 DIAGNOSIS — D125 Benign neoplasm of sigmoid colon: Secondary | ICD-10-CM | POA: Diagnosis not present

## 2014-03-30 DIAGNOSIS — D122 Benign neoplasm of ascending colon: Secondary | ICD-10-CM

## 2014-03-30 HISTORY — PX: COLONOSCOPY: SHX5424

## 2014-03-30 SURGERY — COLONOSCOPY
Anesthesia: Moderate Sedation

## 2014-03-30 MED ORDER — STERILE WATER FOR IRRIGATION IR SOLN
Status: DC | PRN
  Administered 2014-03-30: 09:00:00

## 2014-03-30 MED ORDER — PROMETHAZINE HCL 25 MG/ML IJ SOLN
INTRAMUSCULAR | Status: DC | PRN
  Administered 2014-03-30: 12.5 mg via INTRAVENOUS

## 2014-03-30 MED ORDER — MEPERIDINE HCL 100 MG/ML IJ SOLN
INTRAMUSCULAR | Status: AC
  Filled 2014-03-30: qty 2

## 2014-03-30 MED ORDER — SODIUM CHLORIDE 0.9 % IV SOLN
INTRAVENOUS | Status: DC
  Administered 2014-03-30: 08:00:00 via INTRAVENOUS

## 2014-03-30 MED ORDER — MIDAZOLAM HCL 5 MG/5ML IJ SOLN
INTRAMUSCULAR | Status: AC
  Filled 2014-03-30: qty 10

## 2014-03-30 MED ORDER — MIDAZOLAM HCL 5 MG/5ML IJ SOLN
INTRAMUSCULAR | Status: DC | PRN
  Administered 2014-03-30 (×2): 2 mg via INTRAVENOUS

## 2014-03-30 MED ORDER — MEPERIDINE HCL 100 MG/ML IJ SOLN
INTRAMUSCULAR | Status: DC | PRN
  Administered 2014-03-30: 50 mg via INTRAVENOUS
  Administered 2014-03-30: 25 mg via INTRAVENOUS

## 2014-03-30 MED ORDER — SODIUM CHLORIDE 0.9 % IJ SOLN
INTRAMUSCULAR | Status: AC
  Filled 2014-03-30: qty 10

## 2014-03-30 MED ORDER — PROMETHAZINE HCL 25 MG/ML IJ SOLN
INTRAMUSCULAR | Status: AC
  Filled 2014-03-30: qty 1

## 2014-03-30 NOTE — Discharge Instructions (Signed)
You had 3 polyps removed. You have LARGE internal hemorrhoids and diverticulosis IN YOUR RIGHT AND LEFT COLON. YOU HAVE A FLOPPY LEFT COLON.   FOLLOW A HIGH FIBER DIET. AVOID ITEMS THAT CAUSE BLOATING. SEE INFO BELOW.  YOUR BIOPSY RESULTS SHOULD BE BACK IN 14 DAYS.  Next colonoscopy in 3-5 years.   Colonoscopy Care After Read the instructions outlined below and refer to this sheet in the next week. These discharge instructions provide you with general information on caring for yourself after you leave the hospital. While your treatment has been planned according to the most current medical practices available, unavoidable complications occasionally occur. If you have any problems or questions after discharge, call DR. Tylin Stradley, [phone].  ACTIVITY  You may resume your regular activity, but move at a slower pace for the next 24 hours.   Take frequent rest periods for the next 24 hours.   Walking will help get rid of the air and reduce the bloated feeling in your belly (abdomen).   No driving for 24 hours (because of the medicine (anesthesia) used during the test).   You may shower.   Do not sign any important legal documents or operate any machinery for 24 hours (because of the anesthesia used during the test).    NUTRITION  Drink plenty of fluids.   You may resume your normal diet as instructed by your doctor.   Begin with a light meal and progress to your normal diet. Heavy or fried foods are harder to digest and may make you feel sick to your stomach (nauseated).   Avoid alcoholic beverages for 24 hours or as instructed.    MEDICATIONS  You may resume your normal medications.   WHAT YOU CAN EXPECT TODAY  Some feelings of bloating in the abdomen.   Passage of more gas than usual.   Spotting of blood in your stool or on the toilet paper  .  IF YOU HAD POLYPS REMOVED DURING THE COLONOSCOPY:  Eat a soft diet IF YOU HAVE NAUSEA, BLOATING, ABDOMINAL PAIN, OR  VOMITING.    FINDING OUT THE RESULTS OF YOUR TEST Not all test results are available during your visit. DR. Oneida Alar WILL CALL YOU WITHIN 7 DAYS OF YOUR PROCEDUE WITH YOUR RESULTS. Do not assume everything is normal if you have not heard from DR. Eldar Robitaille IN ONE WEEK, CALL HER OFFICE AT [phone].  SEEK IMMEDIATE MEDICAL ATTENTION AND CALL THE OFFICE: [phone] IF:  You have more than a spotting of blood in your stool.   Your belly is swollen (abdominal distention).   You are nauseated or vomiting.   You have a temperature over 101F.   You have abdominal pain or discomfort that is severe or gets worse throughout the day.  Polyps, Colon  A polyp is extra tissue that grows inside your body. Colon polyps grow in the large intestine. The large intestine, also called the colon, is part of your digestive system. It is a long, hollow tube at the end of your digestive tract where your body makes and stores stool. Most polyps are not dangerous. They are benign. This means they are not cancerous. But over time, some types of polyps can turn into cancer. Polyps that are smaller than a pea are usually not harmful. But larger polyps could someday become or may already be cancerous. To be safe, doctors remove all polyps and test them.   WHO GETS POLYPS? Anyone can get polyps, but certain people are more likely than others.  You may have a greater chance of getting polyps if:  You are over 50.   You have had polyps before.   Someone in your family has had polyps.   Someone in your family has had cancer of the large intestine.   Find out if someone in your family has had polyps. You may also be more likely to get polyps if you:   Eat a lot of fatty foods   Smoke   Drink alcohol   Do not exercise  Eat too much   TREATMENT  The caregiver will remove the polyp during sigmoidoscopy or colonoscopy.  PREVENTION There is not one sure way to prevent polyps. You might be able to lower your risk  of getting them if you:  Eat more fruits and vegetables and less fatty food.   Do not smoke.   Avoid alcohol.   Exercise every day.   Lose weight if you are overweight.   Eating more calcium and folate can also lower your risk of getting polyps. Some foods that are rich in calcium are milk, cheese, and broccoli. Some foods that are rich in folate are chickpeas, kidney beans, and spinach.   High-Fiber Diet A high-fiber diet changes your normal diet to include more whole grains, legumes, fruits, and vegetables. Changes in the diet involve replacing refined carbohydrates with unrefined foods. The calorie level of the diet is essentially unchanged. The Dietary Reference Intake (recommended amount) for adult males is 38 grams per day. For adult females, it is 25 grams per day. Pregnant and lactating women should consume 28 grams of fiber per day. Fiber is the intact part of a plant that is not broken down during digestion. Functional fiber is fiber that has been isolated from the plant to provide a beneficial effect in the body. PURPOSE  Increase stool bulk.   Ease and regulate bowel movements.   Lower cholesterol.  INDICATIONS THAT YOU NEED MORE FIBER  Constipation and hemorrhoids.   Uncomplicated diverticulosis (intestine condition) and irritable bowel syndrome.   Weight management.   As a protective measure against hardening of the arteries (atherosclerosis), diabetes, and cancer.   GUIDELINES FOR INCREASING FIBER IN THE DIET  Start adding fiber to the diet slowly. A gradual increase of about 5 more grams (2 slices of whole-wheat bread, 2 servings of most fruits or vegetables, or 1 bowl of high-fiber cereal) per day is best. Too rapid an increase in fiber may result in constipation, flatulence, and bloating.   Drink enough water and fluids to keep your urine clear or pale yellow. Water, juice, or caffeine-free drinks are recommended. Not drinking enough fluid may cause  constipation.   Eat a variety of high-fiber foods rather than one type of fiber.   Try to increase your intake of fiber through using high-fiber foods rather than fiber pills or supplements that contain small amounts of fiber.   The goal is to change the types of food eaten. Do not supplement your present diet with high-fiber foods, but replace foods in your present diet.  INCLUDE A VARIETY OF FIBER SOURCES  Replace refined and processed grains with whole grains, canned fruits with fresh fruits, and incorporate other fiber sources. White rice, white breads, and most bakery goods contain little or no fiber.   Brown whole-grain rice, buckwheat oats, and many fruits and vegetables are all good sources of fiber. These include: broccoli, Brussels sprouts, cabbage, cauliflower, beets, sweet potatoes, white potatoes (skin on), carrots, tomatoes, eggplant, squash, berries,  fresh fruits, and dried fruits.   Cereals appear to be the richest source of fiber. Cereal fiber is found in whole grains and bran. Bran is the fiber-rich outer coat of cereal grain, which is largely removed in refining. In whole-grain cereals, the bran remains. In breakfast cereals, the largest amount of fiber is found in those with "bran" in their names. The fiber content is sometimes indicated on the label.   You may need to include additional fruits and vegetables each day.   In baking, for 1 cup white flour, you may use the following substitutions:   1 cup whole-wheat flour minus 2 tablespoons.   1/2 cup white flour plus 1/2 cup whole-wheat flour.   Diverticulosis Diverticulosis is a common condition that develops when small pouches (diverticula) form in the wall of the colon. The risk of diverticulosis increases with age. It happens more often in people who eat a low-fiber diet. Most individuals with diverticulosis have no symptoms. Those individuals with symptoms usually experience belly (abdominal) pain, constipation, or  loose stools (diarrhea).  HOME CARE INSTRUCTIONS  Increase the amount of fiber in your diet as directed by your caregiver or dietician. This may reduce symptoms of diverticulosis.   Drink at least 6 to 8 glasses of water each day to prevent constipation.   Try not to strain when you have a bowel movement.   Avoiding nuts and seeds to prevent complications is still an uncertain benefit.       FOODS HAVING HIGH FIBER CONTENT INCLUDE:  Fruits. Apple, peach, pear, tangerine, raisins, prunes.   Vegetables. Brussels sprouts, asparagus, broccoli, cabbage, carrot, cauliflower, romaine lettuce, spinach, summer squash, tomato, winter squash, zucchini.   Starchy Vegetables. Baked beans, kidney beans, lima beans, split peas, lentils, potatoes (with skin).   Grains. Whole wheat bread, brown rice, bran flake cereal, plain oatmeal, white rice, shredded wheat, bran muffins.    SEEK IMMEDIATE MEDICAL CARE IF:  You develop increasing pain or severe bloating.   You have an oral temperature above 101F.   You develop vomiting or bowel movements that are bloody or black.   Hemorrhoids Hemorrhoids are dilated (enlarged) veins around the rectum. Sometimes clots will form in the veins. This makes them swollen and painful. These are called thrombosed hemorrhoids. Causes of hemorrhoids include:  Constipation.   Straining to have a bowel movement.   HEAVY LIFTING HOME CARE INSTRUCTIONS  Eat a well balanced diet and drink 6 to 8 glasses of water every day to avoid constipation. You may also use a bulk laxative.   Avoid straining to have bowel movements.   Keep anal area dry and clean.   Do not use a donut shaped pillow or sit on the toilet for long periods. This increases blood pooling and pain.   Move your bowels when your body has the urge; this will require less straining and will decrease pain and pressure.

## 2014-03-30 NOTE — H&P (Signed)
  Primary Care Physician:  Rosita Fire, MD Primary Gastroenterologist:  Dr. Oneida Alar  Pre-Procedure History & Physical: HPI:  Brian Heath is a 63 y.o. male here for COLON CANCER SCREENING.   Past Medical History  Diagnosis Date  . Hypertension   . Gout   . DVT, lower extremity     Left popliteal    Past Surgical History  Procedure Laterality Date  . Appendectomy  1980    Prior to Admission medications   Medication Sig Start Date End Date Taking? Authorizing Provider  allopurinol (ZYLOPRIM) 300 MG tablet Take 300 mg by mouth daily.   Yes Historical Provider, MD  amLODipine (NORVASC) 10 MG tablet Take 10 mg by mouth daily.   Yes Historical Provider, MD  hydrochlorothiazide (HYDRODIURIL) 25 MG tablet Take 12.5 mg by mouth daily.   Yes Historical Provider, MD  lisinopril (PRINIVIL,ZESTRIL) 40 MG tablet Take 40 mg by mouth daily.   Yes Historical Provider, MD  naproxen sodium (ANAPROX) 220 MG tablet Take 220 mg by mouth daily as needed (pain).   Yes Historical Provider, MD  peg 3350 powder (MOVIPREP) 100 G SOLR Take 1 kit (200 g total) by mouth as directed. 03/10/14  Yes Danie Binder, MD    Allergies as of 03/10/2014  . (No Known Allergies)    Family History  Problem Relation Age of Onset  . Colon cancer Neg Hx     History   Social History  . Marital Status: Married    Spouse Name: N/A    Number of Children: N/A  . Years of Education: N/A   Occupational History  . Not on file.   Social History Main Topics  . Smoking status: Current Every Day Smoker -- 1.00 packs/day for 35 years    Types: Cigars, Cigarettes  . Smokeless tobacco: Never Used     Comment: Current consumption less than 1/2 pack per day  . Alcohol Use: 5.0 oz/week    10 drink(s) per week     Comment: 1 -2 glasses of Brandy per evening. Last used 03/29/2014.  . Drug Use: 1.00 per week    Special: Marijuana     Comment: Occasional. Last used 03/29/2014.   Marland Kitchen Sexual Activity: Not on file   Other  Topics Concern  . Not on file   Social History Narrative  . No narrative on file    Review of Systems: See HPI, otherwise negative ROS   Physical Exam: BP 107/74  Pulse 96  Temp(Src) 98 F (36.7 C) (Oral)  Resp 21  Ht $R'5\' 8"'CK$  (1.727 m)  Wt 156 lb (70.761 kg)  BMI 23.73 kg/m2  SpO2 100% General:   Alert,  pleasant and cooperative in NAD Head:  Normocephalic and atraumatic. Neck:  Supple; Lungs:  Clear throughout to auscultation.    Heart:  Regular rate and rhythm. Abdomen:  Soft, nontender and nondistended. Normal bowel sounds, without guarding, and without rebound.   Neurologic:  Alert and  oriented x4;  grossly normal neurologically.  Impression/Plan:     SCREENING  Plan:  1. TCS TODAY

## 2014-03-31 NOTE — Op Note (Addendum)
[facility] [address] [place], [address]   COLONOSCOPY PROCEDURE REPORT  PATIENT: Brian, Heath  MR#: [id number] BIRTHDATE: 1950/08/12 , 35  yrs. old GENDER: male ENDOSCOPIST: Barney Drain, MD REFERRED TA:[id number] Fanta, M.D. PROCEDURE DATE:  03/30/2014 PROCEDURE:   Colonoscopy with snare polypectomy INDICATIONS:average risk for colon cancer. MEDICATIONS: Promethazine (Phenergan) 12.5 mg IV, Demerol 75 mg IV, and Versed 5 mg IV  DESCRIPTION OF PROCEDURE:    Physical exam was performed.  Informed consent was obtained from the patient after explaining the benefits, risks, and alternatives to procedure.  The patient was connected to monitor and placed in left lateral position. Continuous oxygen was provided by nasal cannula and IV medicine administered through an indwelling cannula.  After administration of sedation and rectal exam, the patients rectum was intubated and the EC-3890Li ([id number])  colonoscope was advanced under direct visualization to the ileum.  The scope was removed slowly by carefully examining the color, texture, anatomy, and integrity mucosa on the way out.  The patient was recovered in endoscopy and discharged home in satisfactory condition.       COLON FINDINGS: Three sessile polyps ranging from 5 to 60mm in size were found in the sigmoid colon and ascending colon.  A polypectomy was performed using snare cautery.  The resection was complete, the polyp tissue was completely retrieved and sent to histology, There was moderate diverticulosis noted throughout the entire examined colon with associated muscular hypertrophy and tortuosity.  , Large internal hemorrhoids were found.  , and The colon was redundant. Manual abdominal counter-pressure was used to reach the cecum.  The patient was moved on to their back to reach the cecum.  PREP QUALITY: good.  CECAL W/D TIME: 21 mins          COMPLICATIONS: None  ENDOSCOPIC IMPRESSION: 1.    Three COLON olyp REMOVED 2.   Moderate diverticulosis throughout the entire examined colon 3.   Large internal hemorrhoids  RECOMMENDATIONS: FOLLOW A HIGH FIBER DIET.  AVOID ITEMS THAT CAUSE BLOATING. BIOPSY RESULTS SHOULD BE BACK IN 14 DAYS. Next colonoscopy in 3-5 years.   _______________________________ Lorrin MaisBarney Drain, MD 02-Apr-2014 10:09 AM   CPT CODES:     45385@Colonoscopy , flexible, proximal to splenic flexure; with removal of tumor(s), polyp(s), or other lesion(s) by snare technique ICD CODES:  The ICD and CPT codes recommended by this software are interpretations from the data that the clinical staff has captured with the software.  The verification of the translation of this report to the ICD and CPT codes and modifiers is the sole responsibility of the health care institution and practicing physician where this report was generated.  [employer]. will not be held responsible for the validity of the ICD and CPT codes included on this report.  AMA assumes no liability for data contained or not contained herein. CPT is a Designer, television/film set of the [employer].

## 2014-04-03 ENCOUNTER — Encounter (HOSPITAL_COMMUNITY): Payer: Self-pay | Admitting: Gastroenterology

## 2015-11-04 ENCOUNTER — Other Ambulatory Visit: Payer: Self-pay | Admitting: Acute Care

## 2015-11-04 DIAGNOSIS — F17210 Nicotine dependence, cigarettes, uncomplicated: Secondary | ICD-10-CM

## 2015-11-15 ENCOUNTER — Ambulatory Visit (INDEPENDENT_AMBULATORY_CARE_PROVIDER_SITE_OTHER)
Admission: RE | Admit: 2015-11-15 | Discharge: 2015-11-15 | Disposition: A | Discharge status: Home / Self Care | Payer: PRIVATE HEALTH INSURANCE | Source: Ambulatory Visit | Attending: Acute Care | Admitting: Acute Care

## 2015-11-15 ENCOUNTER — Telehealth: Payer: Self-pay | Admitting: Acute Care

## 2015-11-15 ENCOUNTER — Ambulatory Visit (INDEPENDENT_AMBULATORY_CARE_PROVIDER_SITE_OTHER): Payer: PRIVATE HEALTH INSURANCE | Admitting: Acute Care

## 2015-11-15 ENCOUNTER — Encounter: Payer: Self-pay | Admitting: Acute Care

## 2015-11-15 DIAGNOSIS — F17210 Nicotine dependence, cigarettes, uncomplicated: Secondary | ICD-10-CM

## 2015-11-15 NOTE — Telephone Encounter (Signed)
I called and spoke with Mr. Brian Heath, and was able to give him the results of his low-dose screening CT done today. I explained to him that his lung scan was read as a lung RADS 1S indicating a negative scan. Recommendation per radiology was for repeat CT in 12 months. I also explained to him that there was extensive atherosclerosis including left main and three-vessel coronary artery disease on his scan. Mr. Brian Heath is not currently on statin therapy. He was unable to tell me when the last time his cholesterol was checked. I explained to him that I would share these results with his primary care doctor Dr. Legrand Rams, who I will request follow-up as he feels is clinically indicated as he knows this patient's health history well. Mr. Brian Heath verbalized understanding of the above, and had no further questions at the end of the call. I will fax the results of the scan to Dr. Legrand Rams.

## 2015-11-15 NOTE — Progress Notes (Signed)
Shared Decision Making Visit Lung Cancer Screening Program [phone])   Eligibility:  Age 65 y.o.  Pack Years Smoking History Calculation 35 pack year smoking history (# packs/per year x # years smoked)  Recent History of coughing up blood  no  Unexplained weight loss? no ( >Than 15 pounds within the last 6 months )  Prior History Lung / other cancer no (Diagnosis within the last 5 years already requiring surveillance chest CT Scans).  Smoking Status Current Smoker  Former Smokers: Years since quit: NA  Quit Date: NA  Visit Components:  Discussion included one or more decision making aids. yes  Discussion included risk/benefits of screening. yes  Discussion included potential follow up diagnostic testing for abnormal scans. yes  Discussion included meaning and risk of over diagnosis. yes  Discussion included meaning and risk of False Positives. yes  Discussion included meaning of total radiation exposure. yes  Counseling Included:  Importance of adherence to annual lung cancer LDCT screening. yes  Impact of comorbidities on ability to participate in the program. yes  Ability and willingness to under diagnostic treatment. yes  Smoking Cessation Counseling:  Current Smokers:   Discussed importance of smoking cessation. yes  Information about tobacco cessation classes and interventions provided to patient. yes  Patient provided with "ticket" for LDCT Scan. yes  Symptomatic Patient. no  Counseling  Diagnosis Code: Tobacco Use Z72.0  Asymptomatic Patient yes  Counseling (Intermediate counseling: > three minutes counseling) J8250  Former Smokers:   Discussed the importance of maintaining cigarette abstinence. yes  Diagnosis Code: Personal History of Nicotine Dependence. N39.767  Information about tobacco cessation classes and interventions provided to patient. Yes  Patient provided with "ticket" for LDCT Scan. yes  Written Order for Lung Cancer  Screening with LDCT placed in Epic. Yes (CT Chest Lung Cancer Screening Low Dose W/O CM) [id number] Z12.2-Screening of respiratory organs Z87.891-Personal history of nicotine dependence  I have spent 20 minutes of face to face time with Mr. Yanik  discussing the risks and benefits of lung cancer screening. We viewed a power point together that explained in detail the above noted topics. We paused at intervals to allow for questions to be asked and answered to ensure understanding.We discussed that the single most powerful action that he can take to decrease his risk of developing lung cancer is to quit smoking. We discussed whether or not he  is ready to commit to setting a quit date.He is not ready to set a quit date. We discussed options for tools to aid in quitting smoking including nicotine replacement therapy, non-nicotine medications, support groups, Quit Smart classes, and behavior modification. We discussed that often times setting smaller, more achievable goals, such as eliminating 1 cigarette a day for a week and then 2 cigarettes a day for a week can be helpful in slowly decreasing the number of cigarettes smoked. This allows for a sense of accomplishment as well as providing a clinical benefit. I gave Mr. Czaja the " Be Stronger Than Your Excuses" card with contact information for community resources, classes, free nicotine replacement therapy, and access to mobile apps, text messaging, and on-line smoking cessation help. I have also given him my card and contact information in the event he needs to contact me. We discussed the time and location of the scan, and that either June Leap, CMA, or I will call with the results within 24-48 hours of receiving them. I have provided him with a copy of the power point we viewed  as a resource in the event they need reinforcement of the concepts we discussed today in the office. The patient verbalized understanding of all of  the above and had no further  questions upon leaving the office. They have my contact information in the event they have any further questions.   Magdalen Spatz, NP  11/15/2015

## 2015-11-25 ENCOUNTER — Encounter: Payer: PRIVATE HEALTH INSURANCE | Admitting: Acute Care

## 2015-11-25 ENCOUNTER — Ambulatory Visit: Payer: PRIVATE HEALTH INSURANCE

## 2016-04-04 ENCOUNTER — Other Ambulatory Visit (HOSPITAL_COMMUNITY): Payer: Self-pay | Admitting: Internal Medicine

## 2016-04-04 DIAGNOSIS — N183 Chronic kidney disease, stage 3 unspecified: Secondary | ICD-10-CM

## 2016-04-07 ENCOUNTER — Ambulatory Visit (HOSPITAL_COMMUNITY): Payer: PRIVATE HEALTH INSURANCE

## 2016-04-07 ENCOUNTER — Ambulatory Visit (HOSPITAL_COMMUNITY)
Admission: RE | Admit: 2016-04-07 | Discharge: 2016-04-07 | Disposition: A | Discharge status: Home / Self Care | Payer: PRIVATE HEALTH INSURANCE | Source: Ambulatory Visit | Attending: Internal Medicine | Admitting: Internal Medicine

## 2016-04-07 DIAGNOSIS — N281 Cyst of kidney, acquired: Secondary | ICD-10-CM | POA: Insufficient documentation

## 2016-04-07 DIAGNOSIS — N183 Chronic kidney disease, stage 3 unspecified: Secondary | ICD-10-CM

## 2016-06-26 HISTORY — PX: LAPAROSCOPIC SMALL BOWEL RESECTION: SUR793

## 2016-07-05 ENCOUNTER — Other Ambulatory Visit: Payer: Self-pay | Admitting: Internal Medicine

## 2016-07-05 DIAGNOSIS — F17219 Nicotine dependence, cigarettes, with unspecified nicotine-induced disorders: Secondary | ICD-10-CM

## 2016-07-18 ENCOUNTER — Ambulatory Visit (HOSPITAL_COMMUNITY)
Admission: RE | Admit: 2016-07-18 | Discharge: 2016-07-18 | Disposition: A | Discharge status: Home / Self Care | Payer: BLUE CROSS/BLUE SHIELD | Source: Ambulatory Visit | Attending: Internal Medicine | Admitting: Internal Medicine

## 2016-07-18 ENCOUNTER — Encounter (HOSPITAL_COMMUNITY): Payer: Self-pay

## 2016-10-11 ENCOUNTER — Other Ambulatory Visit (HOSPITAL_COMMUNITY): Payer: Self-pay | Admitting: Internal Medicine

## 2016-10-23 ENCOUNTER — Ambulatory Visit (HOSPITAL_COMMUNITY): Payer: BLUE CROSS/BLUE SHIELD

## 2017-03-06 ENCOUNTER — Encounter: Payer: Self-pay | Admitting: Gastroenterology

## 2017-06-06 IMAGING — US US RENAL
1 series · 14 of 25 positions shown · non-contrast
Comparison: None.

CLINICAL DATA: Renal disease.

EXAM:
RENAL / URINARY TRACT ULTRASOUND COMPLETE

[Series 1: us renal · 0.20mm/px · 14 of 58 slices shown]
[im 1/58]
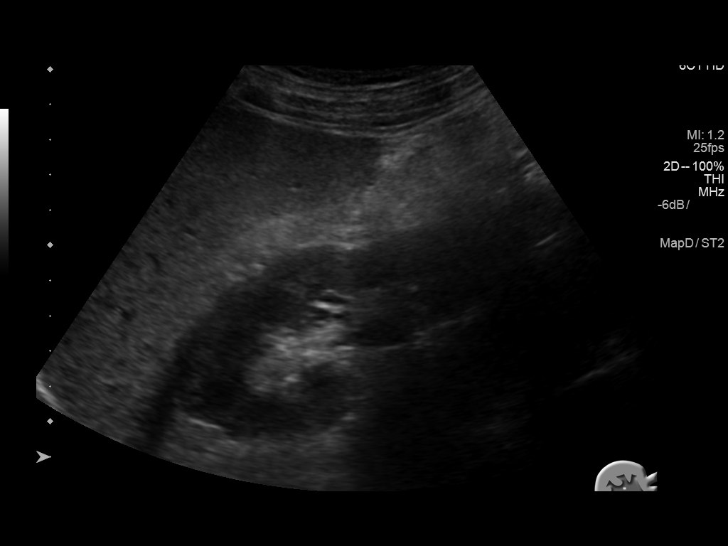
[im 5/58]
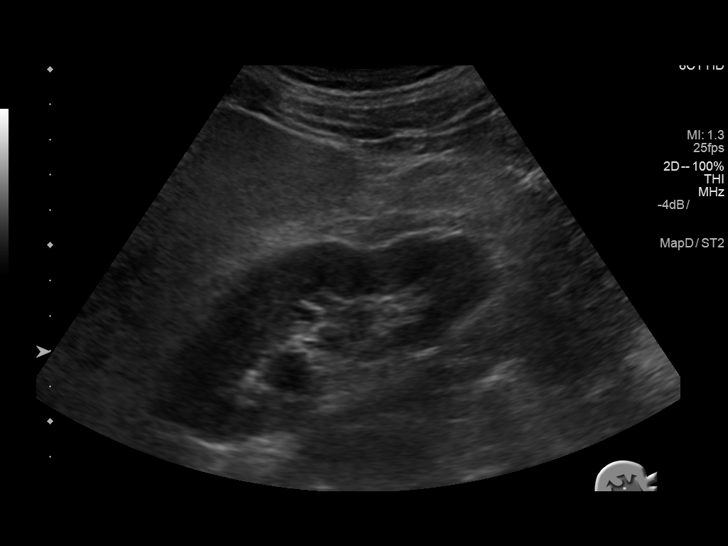
[im 10/58]
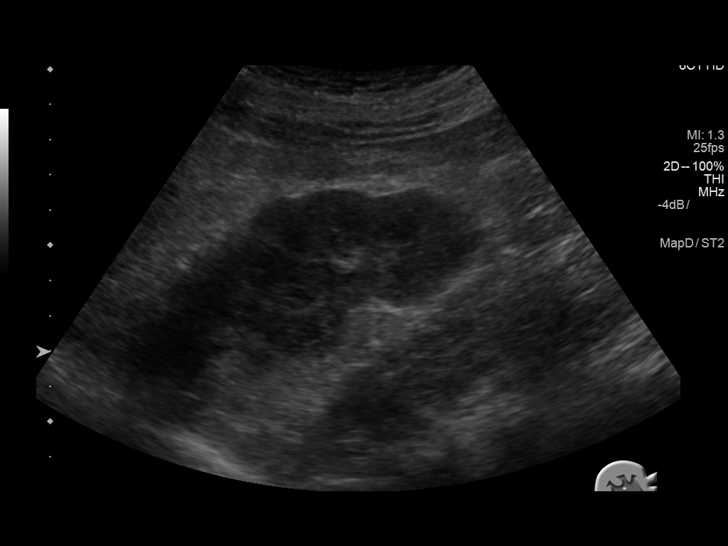
[im 15/58]
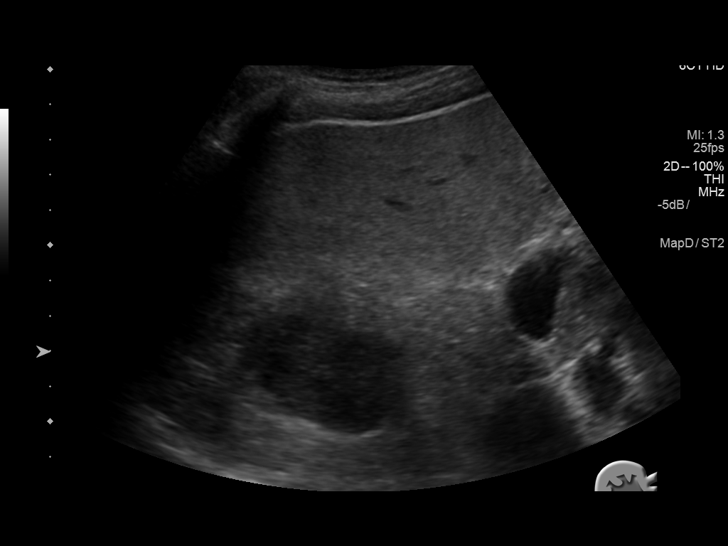
[im 20/58]
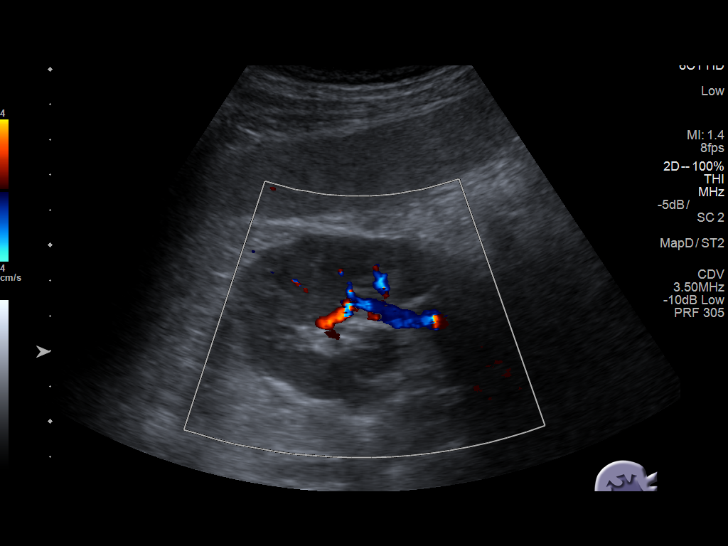
[im 22/58]
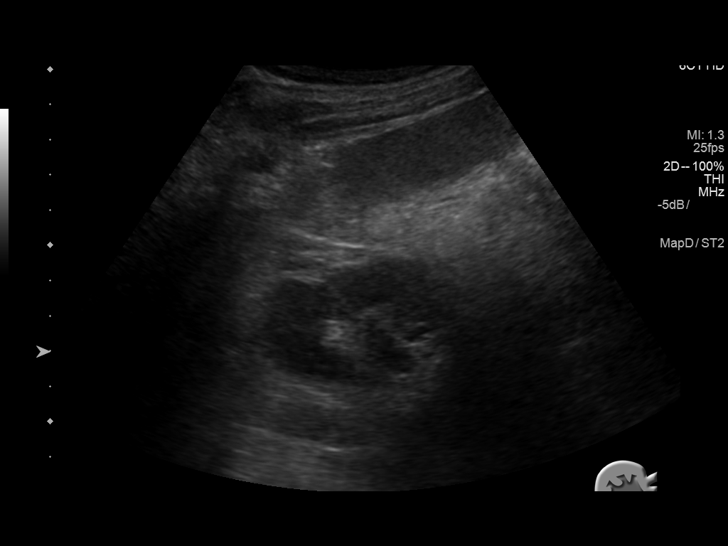
[im 27/58]
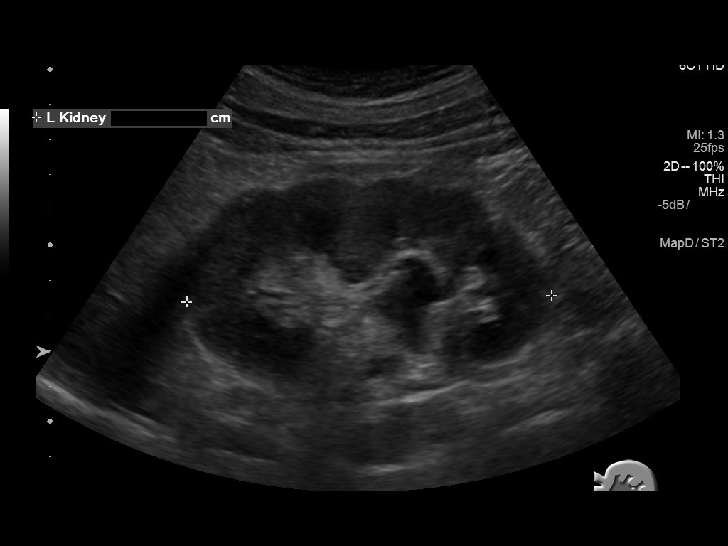
[im 31/58]
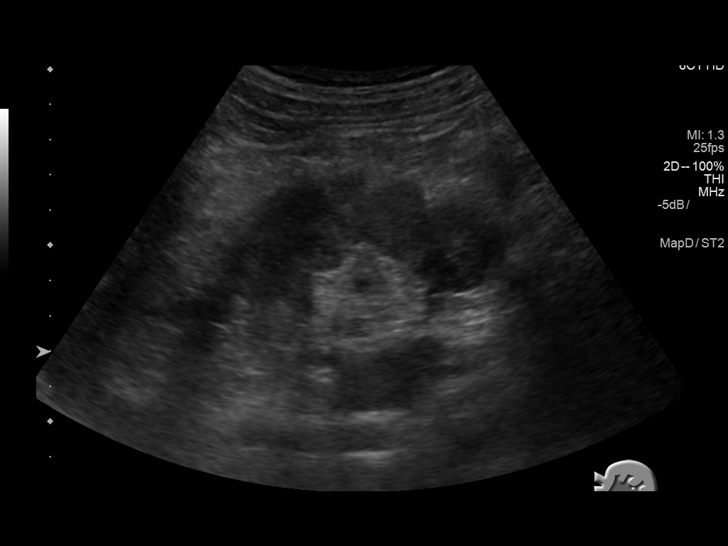
[im 36/58]
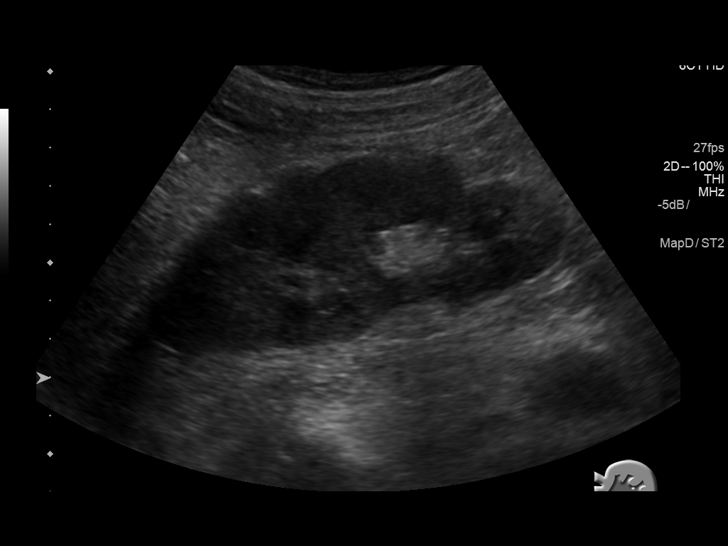
[im 39/58]
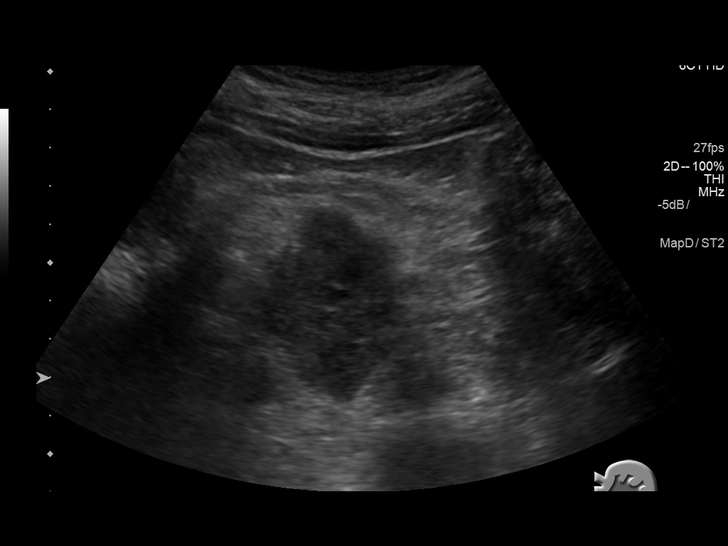
[im 43/58]
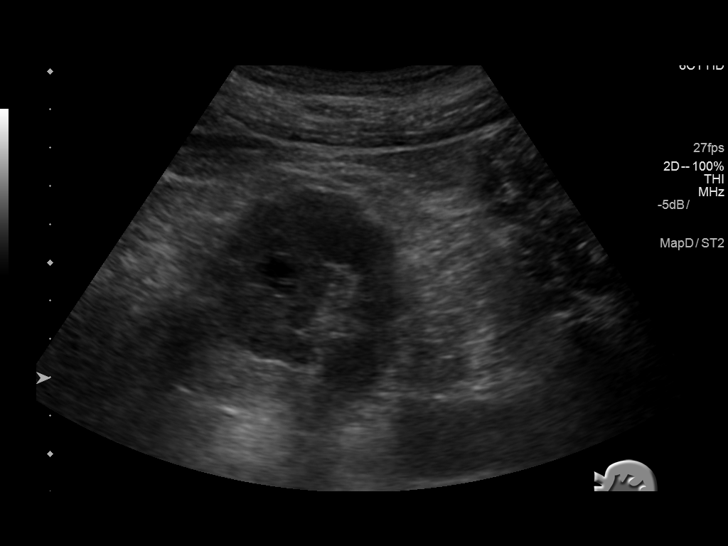
[im 48/58]
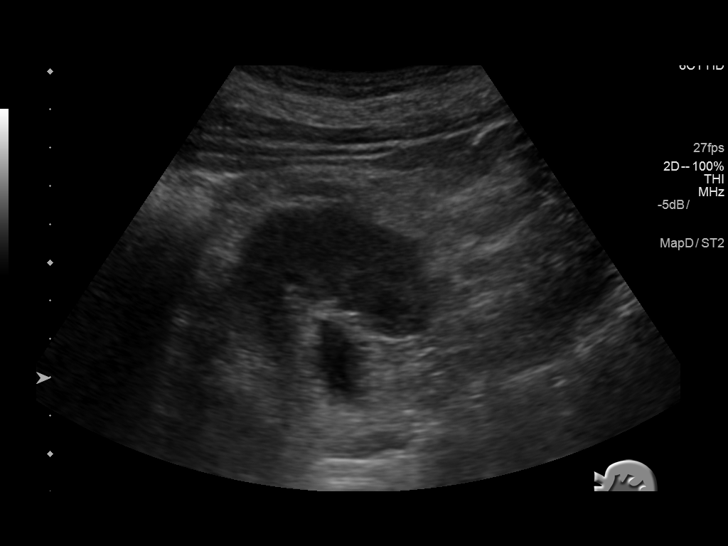
[im 53/58]
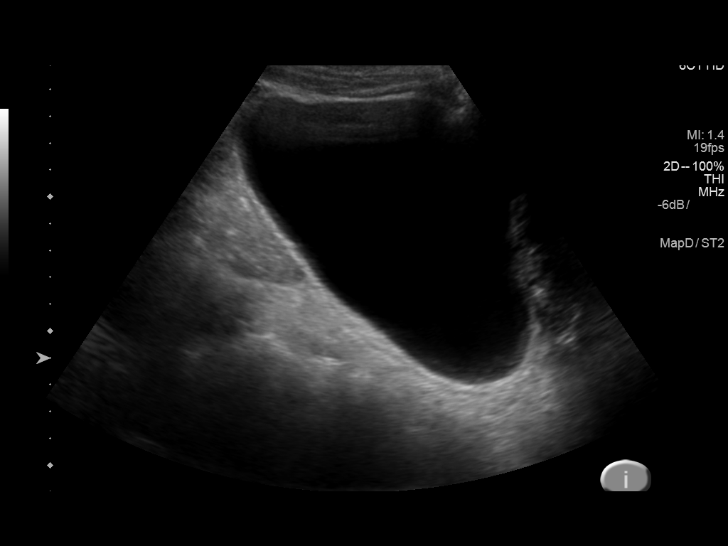
[im 58/58]
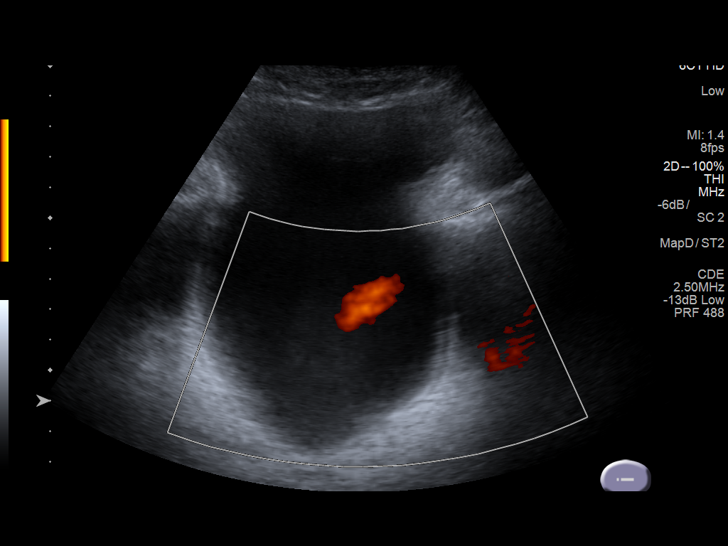

[14 of 25 positions shown; findings below may reference images not displayed]

FINDINGS: Right Kidney:

Length: 10.6 cm. There is prominence of the right renal pelvis with
no hydronephrosis. There is a 1.8 cm cyst in the right kidney.

Left Kidney:

Length: 10.4 cm. There is mild prominence of the left renal pelvis
and a lower pole calyx. There is a 1.9 cm cyst in the left kidney.
This cyst appears to have a single septation.

Bladder:

Appears normal for degree of bladder distention.
IMPRESSION: 1. Prominence of the bilateral renal pelvises and a left lower pole
calyx. The chronicity of these findings is unclear. Bilateral renal
cysts. The cyst on the left appears to have a single thin septation.

## 2017-10-29 DIAGNOSIS — N183 Chronic kidney disease, stage 3 (moderate): Secondary | ICD-10-CM | POA: Diagnosis not present

## 2017-10-29 DIAGNOSIS — I10 Essential (primary) hypertension: Secondary | ICD-10-CM | POA: Diagnosis not present

## 2017-10-29 DIAGNOSIS — M109 Gout, unspecified: Secondary | ICD-10-CM | POA: Diagnosis not present

## 2017-10-29 DIAGNOSIS — F17200 Nicotine dependence, unspecified, uncomplicated: Secondary | ICD-10-CM | POA: Diagnosis not present

## 2017-12-12 DIAGNOSIS — N183 Chronic kidney disease, stage 3 (moderate): Secondary | ICD-10-CM | POA: Diagnosis not present

## 2017-12-12 DIAGNOSIS — T7849XA Other allergy, initial encounter: Secondary | ICD-10-CM | POA: Diagnosis not present

## 2017-12-12 DIAGNOSIS — I10 Essential (primary) hypertension: Secondary | ICD-10-CM | POA: Diagnosis not present

## 2017-12-12 DIAGNOSIS — M109 Gout, unspecified: Secondary | ICD-10-CM | POA: Diagnosis not present

## 2018-01-29 DIAGNOSIS — I10 Essential (primary) hypertension: Secondary | ICD-10-CM | POA: Diagnosis not present

## 2018-01-29 DIAGNOSIS — N183 Chronic kidney disease, stage 3 (moderate): Secondary | ICD-10-CM | POA: Diagnosis not present

## 2018-01-29 DIAGNOSIS — M109 Gout, unspecified: Secondary | ICD-10-CM | POA: Diagnosis not present

## 2018-04-29 ENCOUNTER — Other Ambulatory Visit (HOSPITAL_COMMUNITY): Payer: Self-pay | Admitting: Internal Medicine

## 2018-04-29 ENCOUNTER — Ambulatory Visit (HOSPITAL_COMMUNITY)
Admission: RE | Admit: 2018-04-29 | Discharge: 2018-04-29 | Disposition: A | Discharge status: Home / Self Care | Payer: Medicare Other | Source: Ambulatory Visit | Attending: Internal Medicine | Admitting: Internal Medicine

## 2018-04-29 DIAGNOSIS — J42 Unspecified chronic bronchitis: Secondary | ICD-10-CM

## 2018-04-29 DIAGNOSIS — I10 Essential (primary) hypertension: Secondary | ICD-10-CM | POA: Diagnosis not present

## 2018-04-29 DIAGNOSIS — F17210 Nicotine dependence, cigarettes, uncomplicated: Secondary | ICD-10-CM | POA: Diagnosis not present

## 2018-04-29 DIAGNOSIS — N183 Chronic kidney disease, stage 3 (moderate): Secondary | ICD-10-CM | POA: Diagnosis not present

## 2018-04-29 DIAGNOSIS — J449 Chronic obstructive pulmonary disease, unspecified: Secondary | ICD-10-CM | POA: Diagnosis not present

## 2018-04-29 DIAGNOSIS — F17200 Nicotine dependence, unspecified, uncomplicated: Secondary | ICD-10-CM | POA: Diagnosis not present

## 2018-04-29 DIAGNOSIS — J984 Other disorders of lung: Secondary | ICD-10-CM | POA: Diagnosis not present

## 2018-07-30 DIAGNOSIS — I10 Essential (primary) hypertension: Secondary | ICD-10-CM | POA: Diagnosis not present

## 2018-07-30 DIAGNOSIS — N183 Chronic kidney disease, stage 3 (moderate): Secondary | ICD-10-CM | POA: Diagnosis not present

## 2018-07-30 DIAGNOSIS — J449 Chronic obstructive pulmonary disease, unspecified: Secondary | ICD-10-CM | POA: Diagnosis not present

## 2018-10-30 DIAGNOSIS — M109 Gout, unspecified: Secondary | ICD-10-CM | POA: Diagnosis not present

## 2018-10-30 DIAGNOSIS — N183 Chronic kidney disease, stage 3 (moderate): Secondary | ICD-10-CM | POA: Diagnosis not present

## 2018-10-30 DIAGNOSIS — I10 Essential (primary) hypertension: Secondary | ICD-10-CM | POA: Diagnosis not present

## 2019-02-14 ENCOUNTER — Other Ambulatory Visit: Payer: Self-pay | Admitting: Internal Medicine

## 2019-02-14 ENCOUNTER — Other Ambulatory Visit (HOSPITAL_COMMUNITY): Payer: Self-pay | Admitting: Internal Medicine

## 2019-02-14 DIAGNOSIS — Z1389 Encounter for screening for other disorder: Secondary | ICD-10-CM | POA: Diagnosis not present

## 2019-02-14 DIAGNOSIS — M109 Gout, unspecified: Secondary | ICD-10-CM | POA: Diagnosis not present

## 2019-02-14 DIAGNOSIS — C3490 Malignant neoplasm of unspecified part of unspecified bronchus or lung: Secondary | ICD-10-CM

## 2019-02-14 DIAGNOSIS — Z87891 Personal history of nicotine dependence: Secondary | ICD-10-CM

## 2019-02-14 DIAGNOSIS — I10 Essential (primary) hypertension: Secondary | ICD-10-CM | POA: Diagnosis not present

## 2019-02-14 DIAGNOSIS — N183 Chronic kidney disease, stage 3 (moderate): Secondary | ICD-10-CM | POA: Diagnosis not present

## 2019-02-14 DIAGNOSIS — Z129 Encounter for screening for malignant neoplasm, site unspecified: Secondary | ICD-10-CM

## 2019-02-14 DIAGNOSIS — Z0001 Encounter for general adult medical examination with abnormal findings: Secondary | ICD-10-CM | POA: Diagnosis not present

## 2019-02-19 ENCOUNTER — Encounter: Payer: Self-pay | Admitting: *Deleted

## 2019-02-24 ENCOUNTER — Other Ambulatory Visit: Payer: Self-pay

## 2019-02-24 ENCOUNTER — Ambulatory Visit: Admission: RE | Admit: 2019-02-24 | Payer: Medicare Other | Source: Ambulatory Visit

## 2019-03-11 ENCOUNTER — Ambulatory Visit (HOSPITAL_COMMUNITY)
Admission: RE | Admit: 2019-03-11 | Discharge: 2019-03-11 | Disposition: A | Discharge status: Home / Self Care | Payer: Medicare Other | Source: Ambulatory Visit | Attending: Internal Medicine | Admitting: Internal Medicine

## 2019-03-11 ENCOUNTER — Other Ambulatory Visit: Payer: Self-pay

## 2019-03-11 DIAGNOSIS — Z87891 Personal history of nicotine dependence: Secondary | ICD-10-CM | POA: Diagnosis not present

## 2019-03-17 DIAGNOSIS — N183 Chronic kidney disease, stage 3 (moderate): Secondary | ICD-10-CM | POA: Diagnosis not present

## 2019-03-17 DIAGNOSIS — I10 Essential (primary) hypertension: Secondary | ICD-10-CM | POA: Diagnosis not present

## 2019-03-25 DIAGNOSIS — J449 Chronic obstructive pulmonary disease, unspecified: Secondary | ICD-10-CM | POA: Diagnosis not present

## 2019-03-25 DIAGNOSIS — I10 Essential (primary) hypertension: Secondary | ICD-10-CM | POA: Diagnosis not present

## 2019-03-26 ENCOUNTER — Ambulatory Visit (INDEPENDENT_AMBULATORY_CARE_PROVIDER_SITE_OTHER): Payer: Self-pay | Admitting: *Deleted

## 2019-03-26 ENCOUNTER — Other Ambulatory Visit: Payer: Self-pay

## 2019-03-26 DIAGNOSIS — Z86010 Personal history of colonic polyps: Secondary | ICD-10-CM

## 2019-03-26 NOTE — Progress Notes (Signed)
Gastroenterology Pre-Procedure Review  Request Date: 03/26/2019 Requesting Physician: Dr. Legrand Rams, Last TCS 03/30/2014 done by Dr. Oneida Alar, tubular adenoma  PATIENT REVIEW QUESTIONS: The patient responded to the following health history questions as indicated:    1. Diabetes Melitis: no 2. Joint replacements in the past 12 months: no 3. Major health problems in the past 3 months: no 4. Has an artificial valve or MVP: no 5. Has a defibrillator: no 6. Has been advised in past to take antibiotics in advance of a procedure like teeth cleaning: no 7. Family history of colon cancer: no 8. Alcohol Use: yes, estimated 1/2 gallon a week, pt says he drinks 6 days out of the week 9. Illicit drug Use: yes, marijuana 10. History of sleep apnea: no 11. History of coronary artery or other vascular stents placed within the last 12 months: no 12. History of any prior anesthesia complications: no 13. There is no height or weight on file to calculate BMI. ht: 5'8 wt: 150 lbs    MEDICATIONS & ALLERGIES:    Patient reports the following regarding taking any blood thinners:   Plavix? no Aspirin? no Coumadin? no Brilinta? no Xarelto? no Eliquis? no Pradaxa? no Savaysa? no Effient? no  Patient confirms/reports the following medications:  Current Outpatient Medications  Medication Sig Dispense Refill  . allopurinol (ZYLOPRIM) 300 MG tablet Take 300 mg by mouth daily.    . hydrochlorothiazide (HYDRODIURIL) 25 MG tablet Take 12.5 mg by mouth daily.    . valsartan (DIOVAN) 80 MG tablet Take 80 mg by mouth daily.     No current facility-administered medications for this visit.     Patient confirms/reports the following allergies:  No Known Allergies  No orders of the defined types were placed in this encounter.   AUTHORIZATION INFORMATION Primary Insurance: [employer] Medicare,  ID #: [id number],  Group #:[id number] Pre-Cert / Josem Kaufmann required: No, not required   SCHEDULE INFORMATION: Procedure has been  scheduled as follows:  Date: , Time: Location: [facility] with Dr. Oneida Alar  This Gastroenterology Pre-Precedure Review Form is being routed to the following provider(s): Roseanne Kaufman, NP

## 2019-03-28 DIAGNOSIS — R911 Solitary pulmonary nodule: Secondary | ICD-10-CM | POA: Diagnosis not present

## 2019-03-28 DIAGNOSIS — I10 Essential (primary) hypertension: Secondary | ICD-10-CM | POA: Diagnosis not present

## 2019-03-28 DIAGNOSIS — J410 Simple chronic bronchitis: Secondary | ICD-10-CM | POA: Diagnosis not present

## 2019-03-28 NOTE — Progress Notes (Signed)
Needs Propofol.

## 2019-03-31 ENCOUNTER — Other Ambulatory Visit (HOSPITAL_COMMUNITY): Payer: Self-pay | Admitting: Respiratory Therapy

## 2019-03-31 DIAGNOSIS — J440 Chronic obstructive pulmonary disease with acute lower respiratory infection: Secondary | ICD-10-CM

## 2019-03-31 NOTE — Progress Notes (Signed)
PATIENT SCHEDULED  °

## 2019-04-01 ENCOUNTER — Encounter: Payer: Self-pay | Admitting: *Deleted

## 2019-04-01 NOTE — Progress Notes (Signed)
Mailed letter to pt with appointment information and procedure cancellation.

## 2019-04-13 ENCOUNTER — Encounter: Payer: Self-pay | Admitting: Gastroenterology
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
## 2019-04-14 ENCOUNTER — Other Ambulatory Visit: Payer: Self-pay

## 2019-04-14 ENCOUNTER — Ambulatory Visit (INDEPENDENT_AMBULATORY_CARE_PROVIDER_SITE_OTHER): Payer: Medicare Other | Admitting: Gastroenterology

## 2019-04-14 ENCOUNTER — Encounter: Payer: Self-pay | Admitting: Gastroenterology

## 2019-04-14 DIAGNOSIS — Z86010 Personal history of colonic polyps: Secondary | ICD-10-CM

## 2019-04-14 MED ORDER — CLENPIQ 10-3.5-12 MG-GM -GM/160ML PO SOLN: 1.0000 | Freq: Once | ORAL | 0 refills | Status: AC

## 2019-04-14 NOTE — Patient Instructions (Signed)
We will get you scheduled for your colonoscopy in the near future with Dr. Oneida Alar.   We will follow-up with you as recommended at the time of colonoscopy.   Aliene Altes, PA-C [facility]

## 2019-04-18 ENCOUNTER — Encounter: Payer: Self-pay | Admitting: Gastroenterology

## 2019-04-18 DIAGNOSIS — Z860100 Personal history of colon polyps, unspecified: Secondary | ICD-10-CM | POA: Insufficient documentation

## 2019-04-18 NOTE — Assessment & Plan Note (Signed)
68 year old male with past medical history significant for gout, HTN, alcohol abuse, and daily marijuana use who is presenting to schedule his surveillance colonoscopy.  Last colonoscopy in October 2015 with 3 colon polyps removed, moderate diverticulosis throughout the entire colon, and large internal hemorrhoids.  Pathology with 1 tubular adenoma and 1 benign polyp.  Recommended repeat in 3-5 years.  He is currently without any significant upper or lower GI symptoms.  No alarm symptoms.  No family history of colon cancer.  Proceed with TCS with propofol with Dr. Oneida Alar in the near future. The risks, benefits, and alternatives have been discussed in detail with patient. They have stated understanding and desire to proceed.  Propofol due to significant alcohol use.  Reporting 1/2 pint of brandy daily. Follow-up as recommended at the time of TCS.

## 2019-04-23 ENCOUNTER — Encounter (HOSPITAL_COMMUNITY): Payer: Medicare Other

## 2019-04-28 DIAGNOSIS — I10 Essential (primary) hypertension: Secondary | ICD-10-CM | POA: Diagnosis not present

## 2019-04-28 DIAGNOSIS — M109 Gout, unspecified: Secondary | ICD-10-CM | POA: Diagnosis not present

## 2019-05-14 ENCOUNTER — Ambulatory Visit: Payer: Medicare Other | Admitting: Gastroenterology

## 2019-05-28 DIAGNOSIS — I10 Essential (primary) hypertension: Secondary | ICD-10-CM | POA: Diagnosis not present

## 2019-05-28 DIAGNOSIS — I129 Hypertensive chronic kidney disease with stage 1 through stage 4 chronic kidney disease, or unspecified chronic kidney disease: Secondary | ICD-10-CM | POA: Diagnosis not present

## 2019-06-05 ENCOUNTER — Encounter (HOSPITAL_COMMUNITY): Payer: Medicare Other

## 2019-06-30 ENCOUNTER — Other Ambulatory Visit: Payer: Self-pay | Admitting: Internal Medicine

## 2019-06-30 DIAGNOSIS — Z1389 Encounter for screening for other disorder: Secondary | ICD-10-CM | POA: Diagnosis not present

## 2019-06-30 DIAGNOSIS — F17210 Nicotine dependence, cigarettes, uncomplicated: Secondary | ICD-10-CM | POA: Diagnosis not present

## 2019-06-30 DIAGNOSIS — Z0001 Encounter for general adult medical examination with abnormal findings: Secondary | ICD-10-CM | POA: Diagnosis not present

## 2019-06-30 DIAGNOSIS — I10 Essential (primary) hypertension: Secondary | ICD-10-CM | POA: Diagnosis not present

## 2019-06-30 DIAGNOSIS — I129 Hypertensive chronic kidney disease with stage 1 through stage 4 chronic kidney disease, or unspecified chronic kidney disease: Secondary | ICD-10-CM | POA: Diagnosis not present

## 2019-06-30 DIAGNOSIS — R918 Other nonspecific abnormal finding of lung field: Secondary | ICD-10-CM

## 2019-06-30 DIAGNOSIS — R911 Solitary pulmonary nodule: Secondary | ICD-10-CM | POA: Diagnosis not present

## 2019-07-03 ENCOUNTER — Ambulatory Visit (HOSPITAL_COMMUNITY): Admission: RE | Admit: 2019-07-03 | Payer: Medicare Other | Source: Ambulatory Visit

## 2019-07-04 NOTE — Patient Instructions (Signed)
Brian Heath  07/04/2019     @PREFPERIOPPHARMACY @   Your procedure is scheduled on  07/08/2019 .  Report to [facility] at  1245  P.M.  Call this number if you have problems the morning of surgery:  [phone]   Remember:  Follow the diet and prep instructions given to you by Dr Roseanne Kaufman office.                    Take these medicines the morning of surgery with A SIP OF WATER allopurinol.    Do not wear jewelry, make-up or nail polish.  Do not wear lotions, powders, or perfumes. Please wear deodorant and brush your teeth.  Do not shave 48 hours prior to surgery.  Men may shave face and neck.  Do not bring valuables to the hospital.  [facility] is not responsible for any belongings or valuables.  Contacts, dentures or bridgework may not be worn into surgery.  Leave your suitcase in the car.  After surgery it may be brought to your room.  For patients admitted to the hospital, discharge time will be determined by your treatment team.  Patients discharged the day of surgery will not be allowed to drive home.   Name and phone number of your driver:   family Special instructions:  None  Please read over the following fact sheets that you were given. Anesthesia Post-op Instructions and Care and Recovery After Surgery       Colonoscopy, Adult, Care After This sheet gives you information about how to care for yourself after your procedure. Your health care provider may also give you more specific instructions. If you have problems or questions, contact your health care provider. What can I expect after the procedure? After the procedure, it is common to have:  A small amount of blood in your stool for 24 hours after the procedure.  Some gas.  Mild cramping or bloating of your abdomen. Follow these instructions at home: Eating and drinking   Drink enough fluid to keep your urine pale yellow.  Follow instructions from your health care provider about eating or  drinking restrictions.  Resume your normal diet as instructed by your health care provider. Avoid heavy or fried foods that are hard to digest. Activity  Rest as told by your health care provider.  Avoid sitting for a long time without moving. Get up to take short walks every 1-2 hours. This is important to improve blood flow and breathing. Ask for help if you feel weak or unsteady.  Return to your normal activities as told by your health care provider. Ask your health care provider what activities are safe for you. Managing cramping and bloating   Try walking around when you have cramps or feel bloated.  Apply heat to your abdomen as told by your health care provider. Use the heat source that your health care provider recommends, such as a moist heat pack or a heating pad. ? Place a towel between your skin and the heat source. ? Leave the heat on for 20-30 minutes. ? Remove the heat if your skin turns bright red. This is especially important if you are unable to feel pain, heat, or cold. You may have a greater risk of getting burned. General instructions  For the first 24 hours after the procedure: ? Do not drive or use machinery. ? Do not sign important documents. ? Do not drink alcohol. ? Do your  regular daily activities at a slower pace than normal. ? Eat soft foods that are easy to digest.  Take over-the-counter and prescription medicines only as told by your health care provider.  Keep all follow-up visits as told by your health care provider. This is important. Contact a health care provider if:  You have blood in your stool 2-3 days after the procedure. Get help right away if you have:  More than a small spotting of blood in your stool.  Large blood clots in your stool.  Swelling of your abdomen.  Nausea or vomiting.  A fever.  Increasing pain in your abdomen that is not relieved with medicine. Summary  After the procedure, it is common to have a small amount  of blood in your stool. You may also have mild cramping and bloating of your abdomen.  For the first 24 hours after the procedure, do not drive or use machinery, sign important documents, or drink alcohol.  Get help right away if you have a lot of blood in your stool, nausea or vomiting, a fever, or increased pain in your abdomen. This information is not intended to replace advice given to you by your health care provider. Make sure you discuss any questions you have with your health care provider. Document Revised: 01/06/2019 Document Reviewed: 01/06/2019 Elsevier Patient Education  [place] After These instructions provide you with information about caring for yourself after your procedure. Your health care provider may also give you more specific instructions. Your treatment has been planned according to current medical practices, but problems sometimes occur. Call your health care provider if you have any problems or questions after your procedure. What can I expect after the procedure? After your procedure, you may:  Feel sleepy for several hours.  Feel clumsy and have poor balance for several hours.  Feel forgetful about what happened after the procedure.  Have poor judgment for several hours.  Feel nauseous or vomit.  Have a sore throat if you had a breathing tube during the procedure. Follow these instructions at home: For at least 24 hours after the procedure:      Have a responsible adult stay with you. It is important to have someone help care for you until you are awake and alert.  Rest as needed.  Do not: ? Participate in activities in which you could fall or become injured. ? Drive. ? Use heavy machinery. ? Drink alcohol. ? Take sleeping pills or medicines that cause drowsiness. ? Make important decisions or sign legal documents. ? Take care of children on your own. Eating and drinking  Follow the diet that is  recommended by your health care provider.  If you vomit, drink water, juice, or soup when you can drink without vomiting.  Make sure you have little or no nausea before eating solid foods. General instructions  Take over-the-counter and prescription medicines only as told by your health care provider.  If you have sleep apnea, surgery and certain medicines can increase your risk for breathing problems. Follow instructions from your health care provider about wearing your sleep device: ? Anytime you are sleeping, including during daytime naps. ? While taking prescription pain medicines, sleeping medicines, or medicines that make you drowsy.  If you smoke, do not smoke without supervision.  Keep all follow-up visits as told by your health care provider. This is important. Contact a health care provider if:  You keep feeling nauseous or you keep vomiting.  You feel light-headed.  You develop a rash.  You have a fever. Get help right away if:  You have trouble breathing. Summary  For several hours after your procedure, you may feel sleepy and have poor judgment.  Have a responsible adult stay with you for at least 24 hours or until you are awake and alert. This information is not intended to replace advice given to you by your health care provider. Make sure you discuss any questions you have with your health care provider. Document Revised: 09/10/2017 Document Reviewed: 10/03/2015 Elsevier Patient Education  [place].

## 2019-07-07 ENCOUNTER — Other Ambulatory Visit (HOSPITAL_COMMUNITY)
Admission: RE | Admit: 2019-07-07 | Discharge: 2019-07-07 | Disposition: A | Discharge status: Home / Self Care | Payer: Medicare Other | Source: Ambulatory Visit | Attending: Gastroenterology | Admitting: Gastroenterology

## 2019-07-07 ENCOUNTER — Encounter (HOSPITAL_COMMUNITY): Payer: Self-pay

## 2019-07-07 ENCOUNTER — Telehealth: Payer: Self-pay

## 2019-07-07 ENCOUNTER — Other Ambulatory Visit: Payer: Self-pay

## 2019-07-07 ENCOUNTER — Telehealth: Payer: Self-pay | Admitting: *Deleted

## 2019-07-07 ENCOUNTER — Encounter (HOSPITAL_COMMUNITY)
Admission: RE | Admit: 2019-07-07 | Discharge: 2019-07-07 | Disposition: A | Discharge status: Home / Self Care | Payer: Medicare Other | Source: Ambulatory Visit | Attending: Gastroenterology | Admitting: Gastroenterology

## 2019-07-07 DIAGNOSIS — Z01812 Encounter for preprocedural laboratory examination: Secondary | ICD-10-CM | POA: Insufficient documentation

## 2019-07-07 LAB — BASIC METABOLIC PANEL
Anion gap: 12 (ref 5–15)
BUN: 9 mg/dL (ref 8–23)
CO2: 27 mmol/L (ref 22–32)
Calcium: 9.7 mg/dL (ref 8.9–10.3)
Chloride: 104 mmol/L (ref 98–111)
Creatinine, Ser: 1.58 mg/dL — ABNORMAL HIGH (ref 0.61–1.24)
GFR calc Af Amer: 52 mL/min — ABNORMAL LOW (ref >60)
GFR calc non Af Amer: 45 mL/min — ABNORMAL LOW (ref >60)
Glucose, Bld: 102 mg/dL — ABNORMAL HIGH (ref 70–99)
Potassium: 2.6 mmol/L — CL (ref 3.5–5.1)
Sodium: 143 mmol/L (ref 135–145)

## 2019-07-07 LAB — SARS CORONAVIRUS 2 (TAT 6-24 HRS): SARS Coronavirus 2: NEGATIVE

## 2019-07-07 MED ORDER — CLENPIQ 10-3.5-12 MG-GM -GM/160ML PO SOLN: 1.0000 | Freq: Once | ORAL | 0 refills | Status: AC

## 2019-07-07 NOTE — Telephone Encounter (Signed)
Rx was sent when patient was scheduled in OCT. New Rx sent to [facility] per pt request

## 2019-07-07 NOTE — Progress Notes (Signed)
Pt Potassium level 2.6.  Dr. Oneida Alar notified.  Prescription called in to [facility] for Potassium 40 meq BID x 3 days. Contacted and instructed patient to pick up and start taking prescription asap. We will draw an Istat on arrival in am prior to the procedure.

## 2019-07-07 NOTE — Telephone Encounter (Signed)
PATIENT CALLED AND STATED HIS PREP IS NOT AT HIS PHARMACY- HE STATES [facility] [place] [facility]  [phone]

## 2019-07-07 NOTE — Telephone Encounter (Signed)
Called pt, TCS for tomorrow moved up to 10:15am. Advised him to arrive at 8:45am. Start drinking 2nd half of prep at 5:15am, NPO after 7:15am. Endo scheduler informed.

## 2019-07-07 NOTE — Addendum Note (Signed)
Addended by: Cheron Every on: 07/07/2019 10:57 AM   Modules accepted: Orders

## 2019-07-08 ENCOUNTER — Ambulatory Visit (HOSPITAL_COMMUNITY): Payer: Medicare Other | Admitting: Anesthesiology

## 2019-07-08 ENCOUNTER — Ambulatory Visit (HOSPITAL_COMMUNITY)
Admission: RE | Admit: 2019-07-08 | Discharge: 2019-07-08 | Disposition: A | Discharge status: Home / Self Care | Payer: Medicare Other | Attending: Gastroenterology | Admitting: Gastroenterology

## 2019-07-08 ENCOUNTER — Encounter (HOSPITAL_COMMUNITY): Payer: Self-pay | Admitting: Gastroenterology

## 2019-07-08 ENCOUNTER — Encounter (HOSPITAL_COMMUNITY)
Admission: RE | Disposition: A | Discharge status: Home / Self Care | Payer: Self-pay | Source: Home / Self Care | Attending: Gastroenterology

## 2019-07-08 DIAGNOSIS — D124 Benign neoplasm of descending colon: Secondary | ICD-10-CM | POA: Insufficient documentation

## 2019-07-08 DIAGNOSIS — K635 Polyp of colon: Secondary | ICD-10-CM | POA: Diagnosis not present

## 2019-07-08 DIAGNOSIS — M109 Gout, unspecified: Secondary | ICD-10-CM | POA: Insufficient documentation

## 2019-07-08 DIAGNOSIS — F17210 Nicotine dependence, cigarettes, uncomplicated: Secondary | ICD-10-CM | POA: Diagnosis not present

## 2019-07-08 DIAGNOSIS — K5730 Diverticulosis of large intestine without perforation or abscess without bleeding: Secondary | ICD-10-CM | POA: Diagnosis not present

## 2019-07-08 DIAGNOSIS — D123 Benign neoplasm of transverse colon: Secondary | ICD-10-CM | POA: Insufficient documentation

## 2019-07-08 DIAGNOSIS — D120 Benign neoplasm of cecum: Secondary | ICD-10-CM | POA: Insufficient documentation

## 2019-07-08 DIAGNOSIS — I10 Essential (primary) hypertension: Secondary | ICD-10-CM | POA: Diagnosis not present

## 2019-07-08 DIAGNOSIS — K648 Other hemorrhoids: Secondary | ICD-10-CM | POA: Diagnosis not present

## 2019-07-08 DIAGNOSIS — Z1211 Encounter for screening for malignant neoplasm of colon: Secondary | ICD-10-CM | POA: Diagnosis not present

## 2019-07-08 DIAGNOSIS — K644 Residual hemorrhoidal skin tags: Secondary | ICD-10-CM | POA: Diagnosis not present

## 2019-07-08 DIAGNOSIS — Z86718 Personal history of other venous thrombosis and embolism: Secondary | ICD-10-CM | POA: Insufficient documentation

## 2019-07-08 DIAGNOSIS — Q438 Other specified congenital malformations of intestine: Secondary | ICD-10-CM | POA: Diagnosis not present

## 2019-07-08 DIAGNOSIS — Z79899 Other long term (current) drug therapy: Secondary | ICD-10-CM | POA: Insufficient documentation

## 2019-07-08 DIAGNOSIS — Z09 Encounter for follow-up examination after completed treatment for conditions other than malignant neoplasm: Secondary | ICD-10-CM | POA: Diagnosis not present

## 2019-07-08 DIAGNOSIS — Z860100 Personal history of colon polyps, unspecified: Secondary | ICD-10-CM

## 2019-07-08 HISTORY — PX: POLYPECTOMY: SHX5525

## 2019-07-08 HISTORY — PX: COLONOSCOPY WITH PROPOFOL: SHX5780

## 2019-07-08 LAB — POCT I-STAT, CHEM 8
BUN: 5 mg/dL — ABNORMAL LOW (ref 8–23)
Calcium, Ion: 1.18 mmol/L (ref 1.15–1.40)
Chloride: 97 mmol/L — ABNORMAL LOW (ref 98–111)
Creatinine, Ser: 1.4 mg/dL — ABNORMAL HIGH (ref 0.61–1.24)
Glucose, Bld: 90 mg/dL (ref 70–99)
HCT: 38 % — ABNORMAL LOW (ref 39.0–52.0)
Hemoglobin: 12.9 g/dL — ABNORMAL LOW (ref 13.0–17.0)
Potassium: 3.3 mmol/L — ABNORMAL LOW (ref 3.5–5.1)
Sodium: 140 mmol/L (ref 135–145)
TCO2: 31 mmol/L (ref 22–32)

## 2019-07-08 SURGERY — COLONOSCOPY WITH PROPOFOL
Anesthesia: General

## 2019-07-08 MED ORDER — PROPOFOL 500 MG/50ML IV EMUL
INTRAVENOUS | Status: DC | PRN
  Administered 2019-07-08: 25 ug/kg/min via INTRAVENOUS
  Administered 2019-07-08: 100 ug via INTRAVENOUS
  Administered 2019-07-08: 150 ug/kg/min via INTRAVENOUS
  Administered 2019-07-08: 120 ug via INTRAVENOUS

## 2019-07-08 MED ORDER — CHLORHEXIDINE GLUCONATE CLOTH 2 % EX PADS: 6.0000 | MEDICATED_PAD | Freq: Once | CUTANEOUS | Status: DC

## 2019-07-08 MED ORDER — PROMETHAZINE HCL 25 MG/ML IJ SOLN: 6.2500 mg | INTRAMUSCULAR | Status: DC | PRN

## 2019-07-08 MED ORDER — POTASSIUM CHLORIDE CRYS ER 20 MEQ PO TBCR
40.0000 meq | EXTENDED_RELEASE_TABLET | Freq: Once | ORAL | Status: AC
  Administered 2019-07-08: 40 meq via ORAL
  Filled 2019-07-08 (×2): qty 2

## 2019-07-08 MED ORDER — HYDROCODONE-ACETAMINOPHEN 7.5-325 MG PO TABS: 1.0000 | ORAL_TABLET | Freq: Once | ORAL | Status: DC | PRN

## 2019-07-08 MED ORDER — GLYCOPYRROLATE 0.2 MG/ML IJ SOLN
INTRAMUSCULAR | Status: DC | PRN
  Administered 2019-07-08: .1 mg via INTRAVENOUS

## 2019-07-08 MED ORDER — LACTATED RINGERS IV SOLN
INTRAVENOUS | Status: DC
  Administered 2019-07-08: 10:00:00 1000 mL via INTRAVENOUS

## 2019-07-08 MED ORDER — MIDAZOLAM HCL 2 MG/2ML IJ SOLN: 0.5000 mg | Freq: Once | INTRAMUSCULAR | Status: DC | PRN

## 2019-07-08 MED ORDER — KETAMINE HCL 50 MG/5ML IJ SOSY
PREFILLED_SYRINGE | INTRAMUSCULAR | Status: AC
  Filled 2019-07-08: qty 5

## 2019-07-08 MED ORDER — LIDOCAINE HCL (CARDIAC) PF 100 MG/5ML IV SOSY
PREFILLED_SYRINGE | INTRAVENOUS | Status: DC | PRN
  Administered 2019-07-08: 50 mg via INTRAVENOUS

## 2019-07-08 MED ORDER — POTASSIUM CHLORIDE 10 MEQ/100ML IV SOLN
10.0000 meq | INTRAVENOUS | Status: AC
  Administered 2019-07-08 (×2): 10 meq via INTRAVENOUS
  Filled 2019-07-08 (×9): qty 100

## 2019-07-08 MED ORDER — PROPOFOL 10 MG/ML IV BOLUS
INTRAVENOUS | Status: AC
  Filled 2019-07-08: qty 40

## 2019-07-08 MED ORDER — HYDROMORPHONE HCL 1 MG/ML IJ SOLN: 0.2500 mg | INTRAMUSCULAR | Status: DC | PRN

## 2019-07-08 MED ORDER — KETAMINE HCL 10 MG/ML IJ SOLN
INTRAMUSCULAR | Status: DC | PRN
  Administered 2019-07-08: 20 mg via INTRAVENOUS
  Administered 2019-07-08: 10 mg via INTRAVENOUS

## 2019-07-08 NOTE — Discharge Instructions (Signed)
You have MODERATE internal AND EXTERNAL hemorrhoids and diverticulosis IN YOUR LEFT AND RIGHT COLON. YOU HAD THREE SMALL POLYPS REMOVED.    DRINK WATER TO KEEP YOUR URINE LIGHT YELLOW.  FOLLOW A HIGH FIBER DIET. AVOID ITEMS THAT CAUSE BLOATING. See info below.   USE PREPARATION H FOUR TIMES  A DAY IF NEEDED TO RELIEVE RECTAL PAIN/PRESSURE/BLEEDING.   YOUR BIOPSY RESULTS WILL BE BACK IN 5 BUSINESS DAYS.  Next colonoscopy in 3-5 years.  Colonoscopy Care After Read the instructions outlined below and refer to this sheet in the next week. These discharge instructions provide you with general information on caring for yourself after you leave the hospital. While your treatment has been planned according to the most current medical practices available, unavoidable complications occasionally occur. If you have any problems or questions after discharge, call DR. Davielle Lingelbach, [phone].  ACTIVITY  You may resume your regular activity, but move at a slower pace for the next 24 hours.   Take frequent rest periods for the next 24 hours.   Walking will help get rid of the air and reduce the bloated feeling in your belly (abdomen).   No driving for 24 hours (because of the medicine (anesthesia) used during the test).   You may shower.   Do not sign any important legal documents or operate any machinery for 24 hours (because of the anesthesia used during the test).    NUTRITION  Drink plenty of fluids.   You may resume your normal diet as instructed by your doctor.   Begin with a light meal and progress to your normal diet. Heavy or fried foods are harder to digest and may make you feel sick to your stomach (nauseated).   Avoid alcoholic beverages for 24 hours or as instructed.    MEDICATIONS  You may resume your normal medications.   WHAT YOU CAN EXPECT TODAY  Some feelings of bloating in the abdomen.   Passage of more gas than usual.   Spotting of blood in your stool or on  the toilet paper  .  IF YOU HAD POLYPS REMOVED DURING THE COLONOSCOPY:  Eat a soft diet IF YOU HAVE NAUSEA, BLOATING, ABDOMINAL PAIN, OR VOMITING.    FINDING OUT THE RESULTS OF YOUR TEST Not all test results are available during your visit. DR. Oneida Alar WILL CALL YOU WITHIN 14 DAYS OF YOUR PROCEDUE WITH YOUR RESULTS. Do not assume everything is normal if you have not heard from DR. Sinjin Amero, CALL HER OFFICE AT [phone].  SEEK IMMEDIATE MEDICAL ATTENTION AND CALL THE OFFICE: [phone] IF:  You have more than a spotting of blood in your stool.   Your belly is swollen (abdominal distention).   You are nauseated or vomiting.   You have a temperature over 101F.   You have abdominal pain or discomfort that is severe or gets worse throughout the day.  High-Fiber Diet A high-fiber diet changes your normal diet to include more whole grains, legumes, fruits, and vegetables. Changes in the diet involve replacing refined carbohydrates with unrefined foods. The calorie level of the diet is essentially unchanged. The Dietary Reference Intake (recommended amount) for adult males is 38 grams per day. For adult females, it is 25 grams per day. Pregnant and lactating women should consume 28 grams of fiber per day. Fiber is the intact part of a plant that is not broken down during digestion. Functional fiber is fiber that has been isolated from the plant to provide a beneficial effect in the  body.  PURPOSE Increase stool bulk.  Ease and regulate bowel movements.  Lower cholesterol.  REDUCE RISK OF COLON CANCER  INDICATIONS THAT YOU NEED MORE FIBER Constipation and hemorrhoids.  Uncomplicated diverticulosis (intestine condition) and irritable bowel syndrome.  Weight management.  As a protective measure against hardening of the arteries (atherosclerosis), diabetes, and cancer.   GUIDELINES FOR INCREASING FIBER IN THE DIET Start adding fiber to the diet slowly. A gradual increase of about 5 more  grams (2 servings of most fruits or vegetables) per day is best. Too rapid an increase in fiber may result in constipation, flatulence, and bloating.  Drink enough water and fluids to keep your urine clear or pale yellow. Water, juice, or caffeine-free drinks are recommended. Not drinking enough fluid may cause constipation.  Eat a variety of high-fiber foods rather than one type of fiber.  Try to increase your intake of fiber through using high-fiber foods rather than fiber pills or supplements that contain small amounts of fiber.  The goal is to change the types of food eaten. Do not supplement your present diet with high-fiber foods, but replace foods in your present diet.    Polyps, Colon  A polyp is extra tissue that grows inside your body. Colon polyps grow in the large intestine. The large intestine, also called the colon, is part of your digestive system. It is a long, hollow tube at the end of your digestive tract where your body makes and stores stool. Most polyps are not dangerous. They are benign. This means they are not cancerous. But over time, some types of polyps can turn into cancer. Polyps that are smaller than a pea are usually not harmful. But larger polyps could someday become or may already be cancerous. To be safe, doctors remove all polyps and test them.   PREVENTION There is not one sure way to prevent polyps. You might be able to lower your risk of getting them if you:  Eat more fruits and vegetables and less fatty food.   Do not smoke.   Avoid alcohol.   Exercise every day.   Lose weight if you are overweight.   Eating more calcium and folate can also lower your risk of getting polyps. Some foods that are rich in calcium are milk, cheese, and broccoli. Some foods that are rich in folate are chickpeas, kidney beans, and spinach.    Diverticulosis Diverticulosis is a common condition that develops when small pouches (diverticula) form in the wall of the colon. The  risk of diverticulosis increases with age. It happens more often in people who eat a low-fiber diet. Most individuals with diverticulosis have no symptoms. Those individuals with symptoms usually experience belly (abdominal) pain, constipation, or loose stools (diarrhea).  HOME CARE INSTRUCTIONS  Increase the amount of fiber in your diet as directed by your caregiver or dietician. This may reduce symptoms of diverticulosis.   Drink at least 6 to 8 glasses of water each day to prevent constipation.   Try not to strain when you have a bowel movement.   Avoiding nuts and seeds to prevent complications is NOT NECESSARY.   FOODS HAVING HIGH FIBER CONTENT INCLUDE:  Fruits. Apple, peach, pear, tangerine, raisins, prunes.   Vegetables. Brussels sprouts, asparagus, broccoli, cabbage, carrot, cauliflower, romaine lettuce, spinach, summer squash, tomato, winter squash, zucchini.   Starchy Vegetables. Baked beans, kidney beans, lima beans, split peas, lentils, potatoes (with skin).    SEEK IMMEDIATE MEDICAL CARE IF:  You develop increasing pain  or severe bloating.   You have an oral temperature above 101F.   You develop vomiting or bowel movements that are bloody or black.

## 2019-07-08 NOTE — Anesthesia Postprocedure Evaluation (Signed)
Anesthesia Post Note  Patient: Brian Heath  Procedure(s) Performed: COLONOSCOPY WITH PROPOFOL (N/A ) POLYPECTOMY  Patient location during evaluation: PACU Anesthesia Type: General Level of consciousness: awake and alert Pain management: pain level controlled Vital Signs Assessment: post-procedure vital signs reviewed and stable Respiratory status: spontaneous breathing Cardiovascular status: stable Postop Assessment: no apparent nausea or vomiting Anesthetic complications: no Comments: Pt on room air;no complaints with stable SaO2     Last Vitals:  Vitals:   07/08/19 0958 07/08/19 1315  BP: (!) 164/96 129/89  Pulse:  81  Resp: 19 (!) 21  Temp: 37 C 36.6 C  SpO2: 99% 99%    Last Pain:  Vitals:   07/08/19 1315  TempSrc:   PainSc: 0-No pain                 Everette Rank

## 2019-07-08 NOTE — Progress Notes (Signed)
K+ on Istat recheck now 3.3.  Order to DC remaining K+ infusions & pt. ok to proceed with procedure per Dr. Oneida Alar & Dr. Hilaria Ota.

## 2019-07-08 NOTE — Op Note (Signed)
[facility] Patient Name: Brian Heath Procedure Date: 07/08/2019 11:04 AM MRN: [id number] Date of Birth: 1950-12-27 Attending MD: Barney Drain MD, MD CSN: [id number] Age: 69 Admit Type: Outpatient Procedure:                Colonoscopy WITH COLD SNARE POLYPECTOMY Indications:              Personal history of colonic polyps Providers:                Barney Drain MD, MD, Otis Peak B. Sharon Seller, RN, Janeece Riggers, RN, Nelma Rothman, Technician Referring MD:             Rosita Fire MD, MD Medicines:                Propofol per Anesthesia Complications:            No immediate complications. Estimated Blood Loss:     Estimated blood loss was minimal. Procedure:                Pre-Anesthesia Assessment:                           - Prior to the procedure, a History and Physical                            was performed, and patient medications and                            allergies were reviewed. The patient's tolerance of                            previous anesthesia was also reviewed. The risks                            and benefits of the procedure and the sedation                            options and risks were discussed with the patient.                            All questions were answered, and informed consent                            was obtained. Prior Anticoagulants: The patient has                            taken no previous anticoagulant or antiplatelet                            agents. ASA Grade Assessment: II - A patient with                            mild systemic disease. After reviewing the risks  and benefits, the patient was deemed in                            satisfactory condition to undergo the procedure.                            After obtaining informed consent, the colonoscope                            was passed under direct vision. Throughout the                            procedure, the patient's blood  pressure, pulse, and                            oxygen saturations were monitored continuously. The                            CF-[id number] ([id number]) scope was introduced through                            the anus and advanced to the the cecum, identified                            by appendiceal orifice and ileocecal valve. The                            colonoscopy was somewhat difficult due to a                            tortuous colon. Successful completion of the                            procedure was aided by straightening and shortening                            the scope to obtain bowel loop reduction and                            COLOWRAP. The patient tolerated the procedure well.                            The quality of the bowel preparation was good. The                            ileocecal valve, appendiceal orifice, and rectum                            were photographed. Scope In: 12:40:36 PM Scope Out: 1:09:44 PM Scope Withdrawal Time: 0 hours 22 minutes 7 seconds  Total Procedure Duration: 0 hours 29 minutes 8 seconds  Findings:      Three sessile polyps were found in the descending colon, transverse       colon and cecum. The  polyps were 2 to 4 mm in size. These polyps were       removed with a cold snare. Resection and retrieval were complete.      Multiple small and large-mouthed diverticula were found in the entire       colon.      External and internal hemorrhoids were found. The hemorrhoids were       moderate.      The recto-sigmoid colon, sigmoid colon and descending colon were       moderately tortuous. Impression:               - Three 2 to 4 mm polyps in the descending colon,                            in the transverse colon and in the cecum, removed                            with a cold snare. Resected and retrieved.                           - MODERATE Diverticulosis in the entire examined                            colon.                            - External and internal hemorrhoids.                           - MODERATELY Tortuous LEFT colon. Moderate Sedation:      Per Anesthesia Care Recommendation:           - Patient has a contact number available for                            emergencies. The signs and symptoms of potential                            delayed complications were discussed with the                            patient. Return to normal activities tomorrow.                            Written discharge instructions were provided to the                            patient.                           - High fiber diet.                           - Continue present medications.                           - Await pathology results.                           -  Repeat colonoscopy date to be determined after                            pending pathology results are reviewed for                            surveillance. Procedure Code(s):        --- Professional ---                           [phone], Colonoscopy, flexible; with removal of                            tumor(s), polyp(s), or other lesion(s) by snare                            technique Diagnosis Code(s):        --- Professional ---                           K63.5, Polyp of colon                           K64.8, Other hemorrhoids                           Z86.010, Personal history of colonic polyps                           K57.30, Diverticulosis of large intestine without                            perforation or abscess without bleeding                           Q43.8, Other specified congenital malformations of                            intestine CPT copyright 2019 [employer]. All rights reserved. The codes documented in this report are preliminary and upon coder review may  be revised to meet current compliance requirements. Barney Drain, MD Barney Drain MD, MD 07/08/2019 1:30:52 PM This report has been signed electronically. Number of  Addenda: 0

## 2019-07-08 NOTE — H&P (Signed)
Primary Care Physician:  Rosita Fire, MD Primary Gastroenterologist:  Dr. Oneida Alar  Pre-Procedure History & Physical: HPI:  Brian Heath is a 69 y.o. male here for PERSONAL HISTORY OF POLYPS.  Past Medical History:  Diagnosis Date  . DVT, lower extremity (HCC)    Left popliteal  . Gout   . Hypertension     Past Surgical History:  Procedure Laterality Date  . APPENDECTOMY  1980  . COLONOSCOPY N/A 03/30/2014   Surgeon: Danie Binder, MD; 3 colon polyps, moderate diverticulosis throughout the entire colon, large internal hemorrhoids.  Marland Kitchen LAPAROSCOPIC SMALL BOWEL RESECTION  2018    Prior to Admission medications   Medication Sig Start Date End Date Taking? Authorizing Provider  allopurinol (ZYLOPRIM) 300 MG tablet Take 300 mg by mouth daily.   Yes [provider]  Chlorpheniramine-Acetaminophen (CORICIDIN HBP COLD/FLU PO) Take 1 tablet by mouth every 6 (six) hours as needed (cough/cold symptoms).   Yes [provider]  hydrochlorothiazide (HYDRODIURIL) 25 MG tablet Take 12.5 mg by mouth daily.   Yes [provider]  valsartan (DIOVAN) 80 MG tablet Take 80 mg by mouth daily.   Yes [provider]    Allergies as of 04/14/2019  . (No Known Allergies)    Family History  Problem Relation Age of Onset  . Colon cancer Neg Hx     Social History   Socioeconomic History  . Marital status: Married    Spouse name: Not on file  . Number of children: Not on file  . Years of education: Not on file  . Highest education level: Not on file  Occupational History  . Not on file  Tobacco Use  . Smoking status: Current Every Day Smoker    Packs/day: 1.00    Years: 35.00    Pack years: 35.00    Types: Cigars, Cigarettes  . Smokeless tobacco: Never Used  . Tobacco comment: Current consumption less than 1/2 pack per day  Substance and Sexual Activity  . Alcohol use: Yes    Alcohol/week: 10.0 standard drinks    Types: 10 Standard drinks or  equivalent per week    Comment: 1/2 pint brandy a day.   . Drug use: Yes    Frequency: 1.0 times per week    Types: Marijuana    Comment: Daily.   Marland Kitchen Sexual activity: Not on file  Other Topics Concern  . Not on file  Social History Narrative  . Not on file   Social Determinants of Health   Financial Resource Strain:   . Difficulty of Paying Living Expenses: Not on file  Food Insecurity:   . Worried About Charity fundraiser in the Last Year: Not on file  . Ran Out of Food in the Last Year: Not on file  Transportation Needs:   . Lack of Transportation (Medical): Not on file  . Lack of Transportation (Non-Medical): Not on file  Physical Activity:   . Days of Exercise per Week: Not on file  . Minutes of Exercise per Session: Not on file  Stress:   . Feeling of Stress : Not on file  Social Connections:   . Frequency of Communication with Friends and Family: Not on file  . Frequency of Social Gatherings with Friends and Family: Not on file  . Attends Religious Services: Not on file  . Active Member of Clubs or Organizations: Not on file  . Attends Archivist Meetings: Not on file  . Marital Status:  Not on file  Intimate Partner Violence:   . Fear of Current or Ex-Partner: Not on file  . Emotionally Abused: Not on file  . Physically Abused: Not on file  . Sexually Abused: Not on file    Review of Systems: See HPI, otherwise negative ROS   Physical Exam: BP (!) 164/96   Temp 98.6 F (37 C) (Oral)   Resp 19   SpO2 99%  General:   Alert,  pleasant and cooperative in NAD Head:  Normocephalic and atraumatic. Neck:  Supple; Lungs:  Clear throughout to auscultation.    Heart:  Regular rate and rhythm. Abdomen:  Soft, nontender and nondistended. Normal bowel sounds, without guarding, and without rebound.   Neurologic:  Alert and  oriented x4;  grossly normal neurologically.  Impression/Plan:    PERSONAL HISTORY OF POLYPS.  PLAN: 1. TCS TODAY. DISCUSSED  PROCEDURE, BENEFITS, & RISKS: < 1% chance of medication reaction, bleeding, perforation, ASPIRATION, or rupture of spleen/liver requiring surgery to fix it and missed polyps < 1 cm 10-20% of the time.

## 2019-07-08 NOTE — Anesthesia Preprocedure Evaluation (Signed)
Anesthesia Evaluation  Patient identified by MRN, date of birth, ID band Patient awake    Reviewed: Allergy & Precautions, NPO status , Patient's Chart, lab work & pertinent test results  Airway Mallampati: I  TM Distance: >3 FB Neck ROM: Full    Dental no notable dental hx. (+) Edentulous Upper, Edentulous Lower   Pulmonary neg pulmonary ROS, Current Smoker and Patient abstained from smoking.,    Pulmonary exam normal breath sounds clear to auscultation       Cardiovascular Exercise Tolerance: Good hypertension, Pt. on medications negative cardio ROS Normal cardiovascular examI Rhythm:Regular Rate:Normal     Neuro/Psych negative neurological ROS  negative psych ROS   GI/Hepatic negative GI ROS, Bowel prep,(+)     substance abuse  alcohol use and marijuana use, States last MJ use was yesterday    Endo/Other  negative endocrine ROS  Renal/GU negative Renal ROS  negative genitourinary   Musculoskeletal negative musculoskeletal ROS (+)   Abdominal   Peds negative pediatric ROS (+)  Hematology negative hematology ROS (+)   Anesthesia Other Findings K+ low yesterday  Low today -Dr.Fields wants to give IV runs of K+/PO If 3.0 or better will proceed   H/o Gout on meds  H/o DVT per chart    Reproductive/Obstetrics negative OB ROS                             Anesthesia Physical Anesthesia Plan  ASA: II  Anesthesia Plan: General   Post-op Pain Management:    Induction: Intravenous  PONV Risk Score and Plan: TIVA, Propofol infusion and Treatment may vary due to age or medical condition  Airway Management Planned: Nasal Cannula and Simple Face Mask  Additional Equipment:   Intra-op Plan:   Post-operative Plan:   Informed Consent: I have reviewed the patients History and Physical, chart, labs and discussed the procedure including the risks, benefits and alternatives for the  proposed anesthesia with the patient or authorized representative who has indicated his/her understanding and acceptance.     Dental advisory given  Plan Discussed with: CRNA  Anesthesia Plan Comments: (Plan Full PPE use  Plan GA with GETA as needed d/w pt -WTP with same after Q&A)        Anesthesia Quick Evaluation

## 2019-07-08 NOTE — Transfer of Care (Signed)
Immediate Anesthesia Transfer of Care Note  Patient: Brian Heath  Procedure(s) Performed: COLONOSCOPY WITH PROPOFOL (N/A ) POLYPECTOMY  Patient Location: PACU  Anesthesia Type:MAC  Level of Consciousness: awake, alert , oriented and patient cooperative  Airway & Oxygen Therapy: Patient Spontanous Breathing  Post-op Assessment: Report given to RN and Post -op Vital signs reviewed and stable  Post vital signs: Reviewed and stable  Last Vitals:  Vitals Value Taken Time  BP 129/89 07/08/19 1319  Temp    Pulse 90 07/08/19 1321  Resp 27 07/08/19 1321  SpO2 98 % 07/08/19 1321  Vitals shown include unvalidated device data.  Last Pain:  Vitals:   07/08/19 1233  TempSrc:   PainSc: 0-No pain      Patients Stated Pain Goal: 9 (79/[id number])  Complications: No apparent anesthesia complications

## 2019-07-09 LAB — SURGICAL PATHOLOGY

## 2019-07-10 ENCOUNTER — Telehealth: Payer: Self-pay | Admitting: Gastroenterology

## 2019-07-10 NOTE — Telephone Encounter (Signed)
PLEASE CALL PT. HE HAD THREE SIMPLE ADENOMAS REMOVED.     DRINK WATER TO KEEP YOUR URINE LIGHT YELLOW. FOLLOW A HIGH FIBER DIET. AVOID ITEMS THAT CAUSE BLOATING.  USE PREPARATION H FOUR TIMES  A DAY IF NEEDED TO RELIEVE RECTAL PAIN/PRESSURE/BLEEDING. Next colonoscopy in 3 years.

## 2019-07-10 NOTE — Telephone Encounter (Signed)
Reminder in epic °

## 2019-07-11 NOTE — Telephone Encounter (Signed)
PT is aware of results and plan.

## 2019-07-16 ENCOUNTER — Ambulatory Visit (HOSPITAL_COMMUNITY)
Admission: RE | Admit: 2019-07-16 | Discharge: 2019-07-16 | Disposition: A | Discharge status: Home / Self Care | Payer: Medicare Other | Source: Ambulatory Visit | Attending: Internal Medicine | Admitting: Internal Medicine

## 2019-07-16 ENCOUNTER — Other Ambulatory Visit: Payer: Self-pay

## 2019-07-16 DIAGNOSIS — R918 Other nonspecific abnormal finding of lung field: Secondary | ICD-10-CM | POA: Diagnosis not present

## 2019-07-16 DIAGNOSIS — I2510 Atherosclerotic heart disease of native coronary artery without angina pectoris: Secondary | ICD-10-CM | POA: Diagnosis not present

## 2019-07-16 DIAGNOSIS — J432 Centrilobular emphysema: Secondary | ICD-10-CM | POA: Diagnosis not present

## 2019-07-16 DIAGNOSIS — J9809 Other diseases of bronchus, not elsewhere classified: Secondary | ICD-10-CM | POA: Diagnosis not present

## 2019-07-16 DIAGNOSIS — I700 Atherosclerosis of aorta: Secondary | ICD-10-CM | POA: Diagnosis not present

## 2019-07-31 DIAGNOSIS — I10 Essential (primary) hypertension: Secondary | ICD-10-CM | POA: Diagnosis not present

## 2019-07-31 DIAGNOSIS — M109 Gout, unspecified: Secondary | ICD-10-CM | POA: Diagnosis not present

## 2019-08-28 DIAGNOSIS — M109 Gout, unspecified: Secondary | ICD-10-CM | POA: Diagnosis not present

## 2019-08-28 DIAGNOSIS — I10 Essential (primary) hypertension: Secondary | ICD-10-CM | POA: Diagnosis not present

## 2019-09-28 DIAGNOSIS — M109 Gout, unspecified: Secondary | ICD-10-CM | POA: Diagnosis not present

## 2019-09-28 DIAGNOSIS — I10 Essential (primary) hypertension: Secondary | ICD-10-CM | POA: Diagnosis not present

## 2019-10-28 DIAGNOSIS — M109 Gout, unspecified: Secondary | ICD-10-CM | POA: Diagnosis not present

## 2019-10-28 DIAGNOSIS — I10 Essential (primary) hypertension: Secondary | ICD-10-CM | POA: Diagnosis not present

## 2019-11-28 DIAGNOSIS — I10 Essential (primary) hypertension: Secondary | ICD-10-CM | POA: Diagnosis not present

## 2019-11-28 DIAGNOSIS — M109 Gout, unspecified: Secondary | ICD-10-CM | POA: Diagnosis not present

## 2019-12-02 ENCOUNTER — Telehealth: Payer: Self-pay | Admitting: Emergency Medicine

## 2019-12-02 NOTE — Telephone Encounter (Signed)
Pt was last seen on 10/20 Request a rx refill on potassium cl 59meq er tab  Take 2 tabs by mouth twice a day for 3 days

## 2019-12-02 NOTE — Telephone Encounter (Signed)
He will need to obtain potassium from PCP if needed. Potassium prescription was for an acute abnormality in his labs. I do not see any labs since January. He should follow-up with PCP on this.

## 2019-12-02 NOTE — Telephone Encounter (Signed)
Pt was last seen on 10/20 Request a rx refill on potassium cl 79meq er tab  Take 2 tabs by mouth twice a day for 3 days

## 2019-12-03 NOTE — Telephone Encounter (Signed)
Called pt, unable to leave vm. Mailbox is full

## 2019-12-03 NOTE — Telephone Encounter (Signed)
Called pt verified name and dob Notified pt of recommendations Pt stated he will f/u with his pcp

## 2019-12-12 ENCOUNTER — Other Ambulatory Visit: Payer: Self-pay

## 2020-01-01 DIAGNOSIS — R911 Solitary pulmonary nodule: Secondary | ICD-10-CM | POA: Diagnosis not present

## 2020-01-01 DIAGNOSIS — F17210 Nicotine dependence, cigarettes, uncomplicated: Secondary | ICD-10-CM | POA: Diagnosis not present

## 2020-01-01 DIAGNOSIS — I129 Hypertensive chronic kidney disease with stage 1 through stage 4 chronic kidney disease, or unspecified chronic kidney disease: Secondary | ICD-10-CM | POA: Diagnosis not present

## 2020-01-01 DIAGNOSIS — N1831 Chronic kidney disease, stage 3a: Secondary | ICD-10-CM | POA: Diagnosis not present

## 2020-01-01 DIAGNOSIS — M109 Gout, unspecified: Secondary | ICD-10-CM | POA: Diagnosis not present

## 2020-02-01 DIAGNOSIS — I10 Essential (primary) hypertension: Secondary | ICD-10-CM | POA: Diagnosis not present

## 2020-02-01 DIAGNOSIS — M109 Gout, unspecified: Secondary | ICD-10-CM | POA: Diagnosis not present

## 2020-03-03 DIAGNOSIS — I129 Hypertensive chronic kidney disease with stage 1 through stage 4 chronic kidney disease, or unspecified chronic kidney disease: Secondary | ICD-10-CM | POA: Diagnosis not present

## 2020-03-03 DIAGNOSIS — I10 Essential (primary) hypertension: Secondary | ICD-10-CM | POA: Diagnosis not present

## 2020-04-02 DIAGNOSIS — M109 Gout, unspecified: Secondary | ICD-10-CM | POA: Diagnosis not present

## 2020-04-02 DIAGNOSIS — I129 Hypertensive chronic kidney disease with stage 1 through stage 4 chronic kidney disease, or unspecified chronic kidney disease: Secondary | ICD-10-CM | POA: Diagnosis not present

## 2020-05-03 DIAGNOSIS — I129 Hypertensive chronic kidney disease with stage 1 through stage 4 chronic kidney disease, or unspecified chronic kidney disease: Secondary | ICD-10-CM | POA: Diagnosis not present

## 2020-05-03 DIAGNOSIS — M109 Gout, unspecified: Secondary | ICD-10-CM | POA: Diagnosis not present

## 2020-06-02 DIAGNOSIS — I10 Essential (primary) hypertension: Secondary | ICD-10-CM | POA: Diagnosis not present

## 2020-06-02 DIAGNOSIS — M109 Gout, unspecified: Secondary | ICD-10-CM | POA: Diagnosis not present

## 2020-06-09 DIAGNOSIS — I10 Essential (primary) hypertension: Secondary | ICD-10-CM | POA: Diagnosis not present

## 2020-06-09 DIAGNOSIS — M109 Gout, unspecified: Secondary | ICD-10-CM | POA: Diagnosis not present

## 2020-06-09 DIAGNOSIS — I82401 Acute embolism and thrombosis of unspecified deep veins of right lower extremity: Secondary | ICD-10-CM | POA: Diagnosis not present

## 2020-06-09 DIAGNOSIS — F17200 Nicotine dependence, unspecified, uncomplicated: Secondary | ICD-10-CM | POA: Diagnosis not present

## 2020-06-09 DIAGNOSIS — N182 Chronic kidney disease, stage 2 (mild): Secondary | ICD-10-CM | POA: Diagnosis not present

## 2020-06-29 ENCOUNTER — Other Ambulatory Visit (HOSPITAL_COMMUNITY): Payer: Self-pay | Admitting: Gerontology

## 2020-06-29 ENCOUNTER — Other Ambulatory Visit: Payer: Self-pay | Admitting: Gerontology

## 2020-06-29 DIAGNOSIS — I10 Essential (primary) hypertension: Secondary | ICD-10-CM | POA: Diagnosis not present

## 2020-06-29 DIAGNOSIS — Z87891 Personal history of nicotine dependence: Secondary | ICD-10-CM

## 2020-06-29 DIAGNOSIS — F17210 Nicotine dependence, cigarettes, uncomplicated: Secondary | ICD-10-CM | POA: Diagnosis not present

## 2020-06-29 DIAGNOSIS — Z1389 Encounter for screening for other disorder: Secondary | ICD-10-CM | POA: Diagnosis not present

## 2020-06-29 DIAGNOSIS — Z23 Encounter for immunization: Secondary | ICD-10-CM | POA: Diagnosis not present

## 2020-06-29 DIAGNOSIS — R911 Solitary pulmonary nodule: Secondary | ICD-10-CM | POA: Diagnosis not present

## 2020-06-29 DIAGNOSIS — M109 Gout, unspecified: Secondary | ICD-10-CM | POA: Diagnosis not present

## 2020-06-29 DIAGNOSIS — Z0001 Encounter for general adult medical examination with abnormal findings: Secondary | ICD-10-CM | POA: Diagnosis not present

## 2020-07-06 ENCOUNTER — Other Ambulatory Visit: Payer: Self-pay

## 2020-07-06 ENCOUNTER — Ambulatory Visit (HOSPITAL_COMMUNITY)
Admission: RE | Admit: 2020-07-06 | Discharge: 2020-07-06 | Disposition: A | Discharge status: Home / Self Care | Payer: Medicare Other | Source: Ambulatory Visit | Attending: Gerontology | Admitting: Gerontology

## 2020-07-06 DIAGNOSIS — F17210 Nicotine dependence, cigarettes, uncomplicated: Secondary | ICD-10-CM | POA: Diagnosis not present

## 2020-07-06 DIAGNOSIS — Z87891 Personal history of nicotine dependence: Secondary | ICD-10-CM | POA: Diagnosis not present

## 2020-07-07 DIAGNOSIS — E559 Vitamin D deficiency, unspecified: Secondary | ICD-10-CM | POA: Diagnosis not present

## 2020-07-07 DIAGNOSIS — M109 Gout, unspecified: Secondary | ICD-10-CM | POA: Diagnosis not present

## 2020-07-07 DIAGNOSIS — I129 Hypertensive chronic kidney disease with stage 1 through stage 4 chronic kidney disease, or unspecified chronic kidney disease: Secondary | ICD-10-CM | POA: Diagnosis not present

## 2020-07-07 DIAGNOSIS — N1831 Chronic kidney disease, stage 3a: Secondary | ICD-10-CM | POA: Diagnosis not present

## 2020-08-07 DIAGNOSIS — F17200 Nicotine dependence, unspecified, uncomplicated: Secondary | ICD-10-CM | POA: Diagnosis not present

## 2020-08-07 DIAGNOSIS — I129 Hypertensive chronic kidney disease with stage 1 through stage 4 chronic kidney disease, or unspecified chronic kidney disease: Secondary | ICD-10-CM | POA: Diagnosis not present

## 2020-08-25 IMAGING — CT CT CHEST LCS NODULE FOLLOW-UP W/O CM
2 of 3 series · 15 of 36 positions shown, 18 images · non-contrast
Comparison: 03/11/2019.

CLINICAL DATA: Lung cancer screening. Right middle lobe lung nodule
follow-up. Current asymptomatic smoker with 38 pack-year smoking
history.

EXAM:
CT CHEST WITHOUT CONTRAST FOR LUNG CANCER SCREENING NODULE FOLLOW-UP
TECHNIQUE: Multidetector CT imaging of the chest was performed following the
standard protocol without IV contrast.

[Series 2: axial st · axial · 0.62mm/px · z∈[-475,-220]mm · 12 of 61 slices shown, 15 images]
[im 5/61  mediastinal]
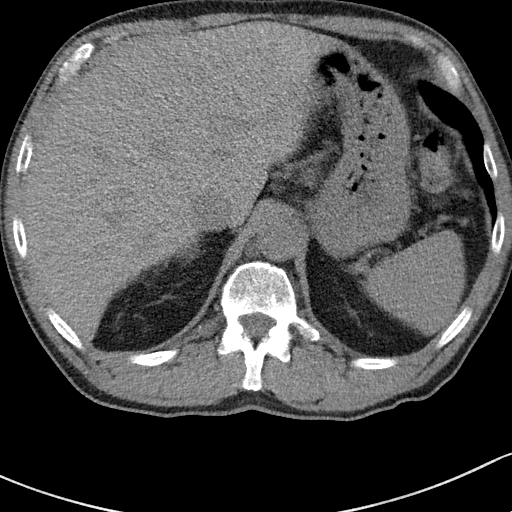
[im 5/61  lung]
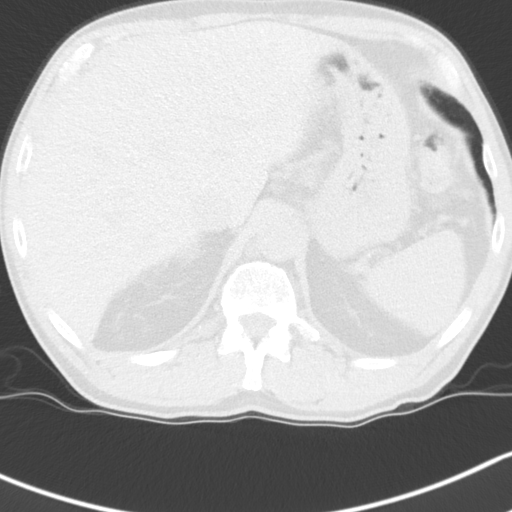
[im 9/61  lung]
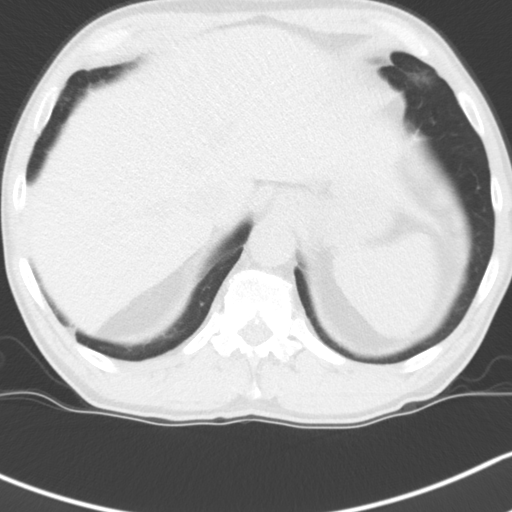
[im 14/61  lung]
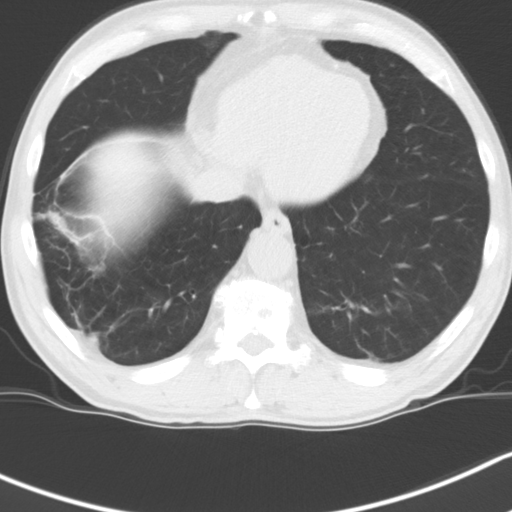
[im 18/61  lung]
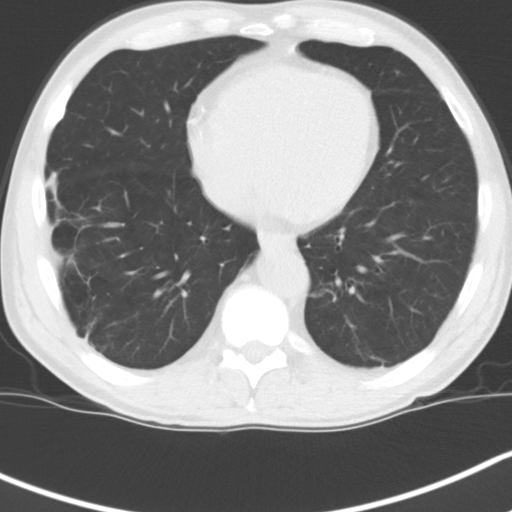
[im 23/61  mediastinal]
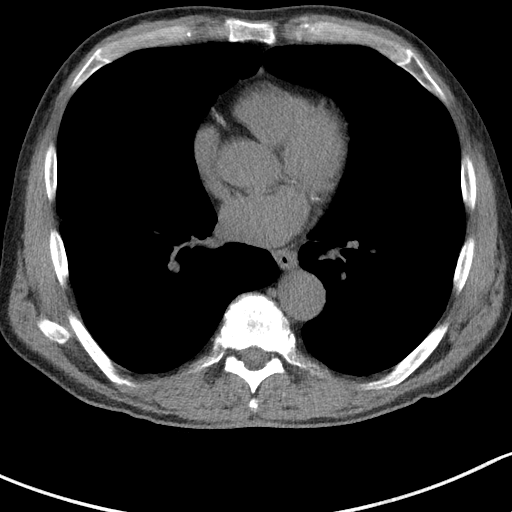
[im 23/61  lung]
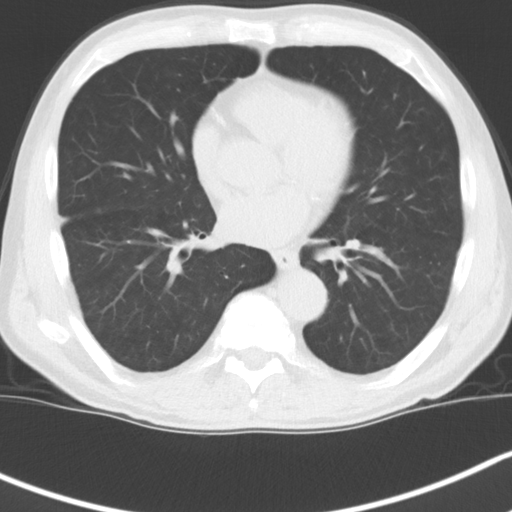
[im 27/61  lung]
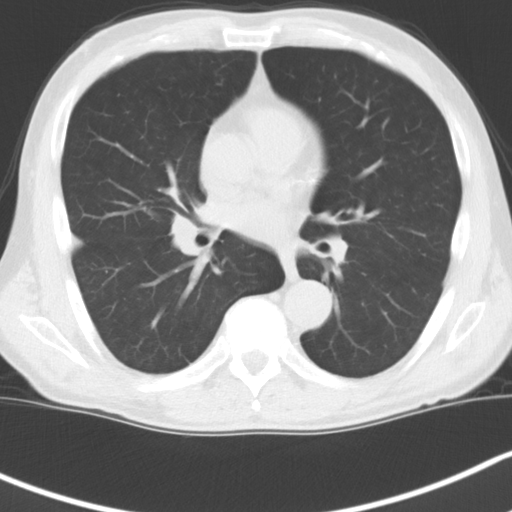
[im 34/61  lung]
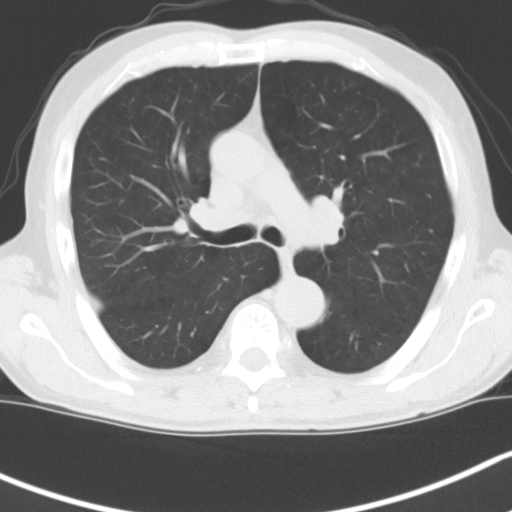
[im 38/61  lung]
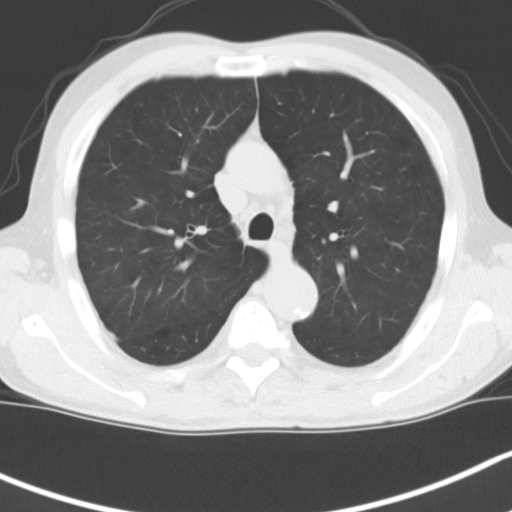
[im 43/61  mediastinal]
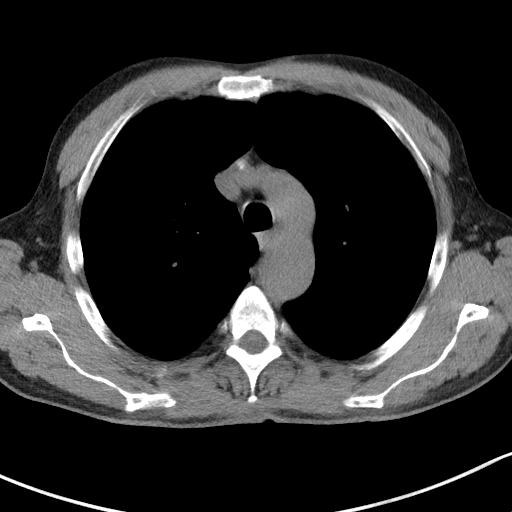
[im 43/61  lung]
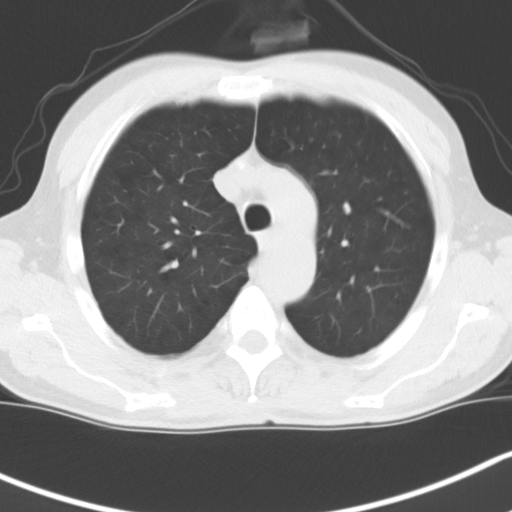
[im 47/61  lung]
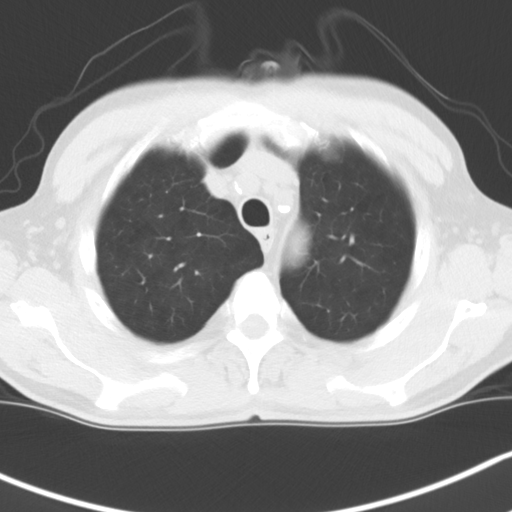
[im 52/61  lung]
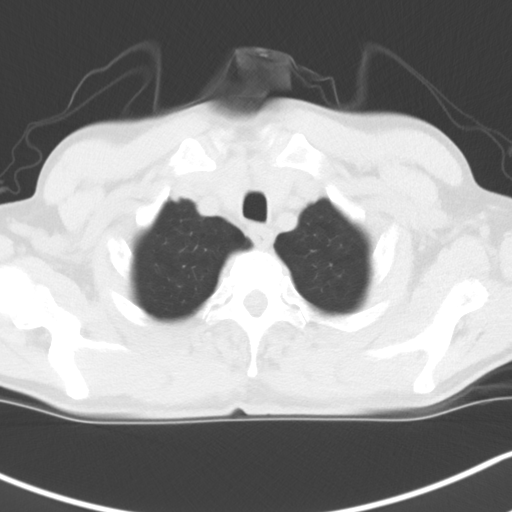
[im 56/61  lung]
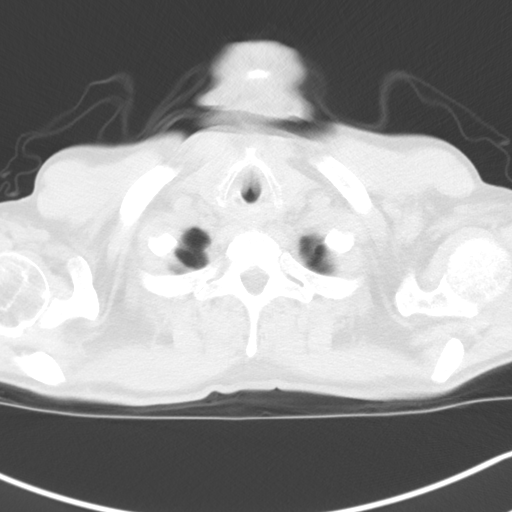

[Series 5: coronal · coronal · 0.61mm/px · 3 of 296 slices shown]
[im 60/296  lung]
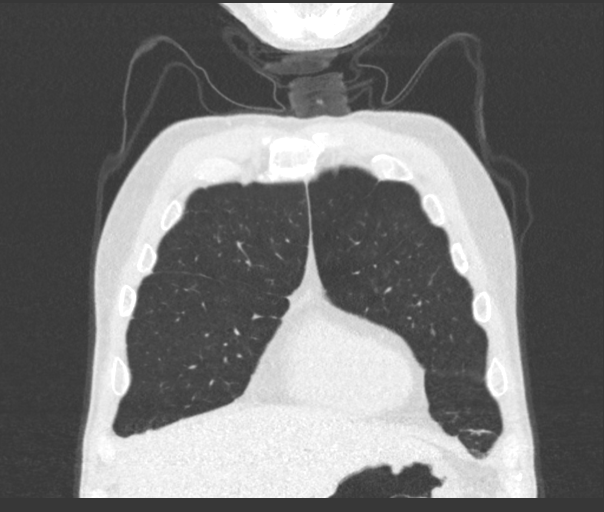
[im 119/296  lung]
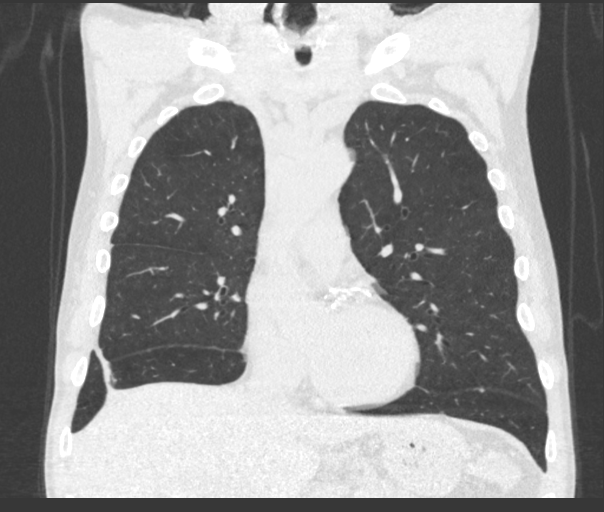
[im 178/296  lung]
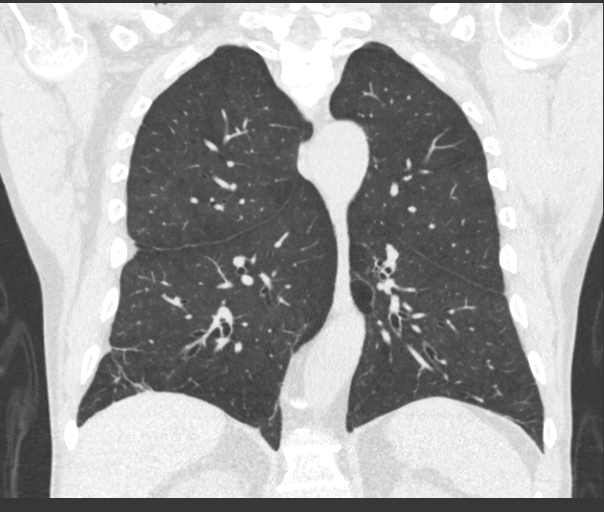

[15 of 36 positions shown; findings below may reference images not displayed]

FINDINGS: Cardiovascular: Normal heart size. There is no pericardial effusion.
Aortic atherosclerosis. Three vessel coronary artery atherosclerotic
calcifications.

Mediastinum/Nodes: No enlarged mediastinal, hilar, or axillary lymph
nodes. Thyroid gland, trachea, and esophagus demonstrate no
significant findings.

Lungs/Pleura: No pleural effusion identified. Diffuse bronchial wall
thickening. Moderate centrilobular emphysema. Scarring is identified
within the posterior and lateral right lower lobe and posteromedial
left lower lobe. Previous 3.9 mm right middle lobe lung nodule is
stable in the interval. No new lung nodules identified.

Upper Abdomen: 1.3 cm low-density structure within anterior dome of
liver is unchanged, favor small cyst. No acute abnormality noted
within the upper abdomen.

Musculoskeletal: No chest wall mass or suspicious bone lesions
identified.2 subacute to chronic posttraumatic deformities involving
the anterior aspect of the right seventh and eighth ribs, new from
previous exam.
IMPRESSION: 1. Lung-RADS 2, benign appearance or behavior. Continue annual
screening with low-dose chest CT without contrast in 12 months.
2. Diffuse bronchial wall thickening with emphysema, as above;
imaging findings suggestive of underlying COPD.
3. 3 vessel coronary artery atherosclerotic calcifications.
4. Subacute to chronic posttraumatic deformities involving the
anterior aspect of the right seventh and eighth ribs.

Aortic Atherosclerosis (2XUCO-46K.K) and Emphysema (2XUCO-CYV.E).

## 2020-09-04 DIAGNOSIS — I10 Essential (primary) hypertension: Secondary | ICD-10-CM | POA: Diagnosis not present

## 2020-09-04 DIAGNOSIS — F17200 Nicotine dependence, unspecified, uncomplicated: Secondary | ICD-10-CM | POA: Diagnosis not present

## 2020-10-05 DIAGNOSIS — I10 Essential (primary) hypertension: Secondary | ICD-10-CM | POA: Diagnosis not present

## 2020-10-05 DIAGNOSIS — I129 Hypertensive chronic kidney disease with stage 1 through stage 4 chronic kidney disease, or unspecified chronic kidney disease: Secondary | ICD-10-CM | POA: Diagnosis not present

## 2020-11-04 DIAGNOSIS — I10 Essential (primary) hypertension: Secondary | ICD-10-CM | POA: Diagnosis not present

## 2020-11-04 DIAGNOSIS — N1831 Chronic kidney disease, stage 3a: Secondary | ICD-10-CM | POA: Diagnosis not present

## 2020-12-05 DIAGNOSIS — I129 Hypertensive chronic kidney disease with stage 1 through stage 4 chronic kidney disease, or unspecified chronic kidney disease: Secondary | ICD-10-CM | POA: Diagnosis not present

## 2020-12-05 DIAGNOSIS — M109 Gout, unspecified: Secondary | ICD-10-CM | POA: Diagnosis not present

## 2021-01-10 DIAGNOSIS — M109 Gout, unspecified: Secondary | ICD-10-CM | POA: Diagnosis not present

## 2021-01-10 DIAGNOSIS — N1831 Chronic kidney disease, stage 3a: Secondary | ICD-10-CM | POA: Diagnosis not present

## 2021-01-10 DIAGNOSIS — F17200 Nicotine dependence, unspecified, uncomplicated: Secondary | ICD-10-CM | POA: Diagnosis not present

## 2021-01-10 DIAGNOSIS — F17210 Nicotine dependence, cigarettes, uncomplicated: Secondary | ICD-10-CM | POA: Diagnosis not present

## 2021-01-10 DIAGNOSIS — I10 Essential (primary) hypertension: Secondary | ICD-10-CM | POA: Diagnosis not present

## 2021-02-10 DIAGNOSIS — I10 Essential (primary) hypertension: Secondary | ICD-10-CM | POA: Diagnosis not present

## 2021-02-10 DIAGNOSIS — N1831 Chronic kidney disease, stage 3a: Secondary | ICD-10-CM | POA: Diagnosis not present

## 2021-03-13 DIAGNOSIS — N1831 Chronic kidney disease, stage 3a: Secondary | ICD-10-CM | POA: Diagnosis not present

## 2021-03-13 DIAGNOSIS — I10 Essential (primary) hypertension: Secondary | ICD-10-CM | POA: Diagnosis not present

## 2021-04-12 DIAGNOSIS — I10 Essential (primary) hypertension: Secondary | ICD-10-CM | POA: Diagnosis not present

## 2021-04-12 DIAGNOSIS — M109 Gout, unspecified: Secondary | ICD-10-CM | POA: Diagnosis not present

## 2021-05-13 DIAGNOSIS — I10 Essential (primary) hypertension: Secondary | ICD-10-CM | POA: Diagnosis not present

## 2021-05-13 DIAGNOSIS — M109 Gout, unspecified: Secondary | ICD-10-CM | POA: Diagnosis not present

## 2021-06-12 DIAGNOSIS — I10 Essential (primary) hypertension: Secondary | ICD-10-CM | POA: Diagnosis not present

## 2021-06-12 DIAGNOSIS — M109 Gout, unspecified: Secondary | ICD-10-CM | POA: Diagnosis not present

## 2021-07-11 DIAGNOSIS — Z1389 Encounter for screening for other disorder: Secondary | ICD-10-CM | POA: Diagnosis not present

## 2021-07-11 DIAGNOSIS — Z0001 Encounter for general adult medical examination with abnormal findings: Secondary | ICD-10-CM | POA: Diagnosis not present

## 2021-07-11 DIAGNOSIS — R911 Solitary pulmonary nodule: Secondary | ICD-10-CM | POA: Diagnosis not present

## 2021-07-11 DIAGNOSIS — F17210 Nicotine dependence, cigarettes, uncomplicated: Secondary | ICD-10-CM | POA: Diagnosis not present

## 2021-07-11 DIAGNOSIS — I10 Essential (primary) hypertension: Secondary | ICD-10-CM | POA: Diagnosis not present

## 2021-07-11 DIAGNOSIS — N1831 Chronic kidney disease, stage 3a: Secondary | ICD-10-CM | POA: Diagnosis not present

## 2021-08-11 DIAGNOSIS — N1831 Chronic kidney disease, stage 3a: Secondary | ICD-10-CM | POA: Diagnosis not present

## 2021-08-11 DIAGNOSIS — I10 Essential (primary) hypertension: Secondary | ICD-10-CM | POA: Diagnosis not present

## 2021-09-08 DIAGNOSIS — M109 Gout, unspecified: Secondary | ICD-10-CM | POA: Diagnosis not present

## 2021-09-08 DIAGNOSIS — I10 Essential (primary) hypertension: Secondary | ICD-10-CM | POA: Diagnosis not present

## 2021-10-09 DIAGNOSIS — M109 Gout, unspecified: Secondary | ICD-10-CM | POA: Diagnosis not present

## 2021-10-09 DIAGNOSIS — I10 Essential (primary) hypertension: Secondary | ICD-10-CM | POA: Diagnosis not present

## 2021-11-08 DIAGNOSIS — I10 Essential (primary) hypertension: Secondary | ICD-10-CM | POA: Diagnosis not present

## 2021-11-08 DIAGNOSIS — J449 Chronic obstructive pulmonary disease, unspecified: Secondary | ICD-10-CM | POA: Diagnosis not present

## 2021-12-09 DIAGNOSIS — I10 Essential (primary) hypertension: Secondary | ICD-10-CM | POA: Diagnosis not present

## 2021-12-09 DIAGNOSIS — I82401 Acute embolism and thrombosis of unspecified deep veins of right lower extremity: Secondary | ICD-10-CM | POA: Diagnosis not present

## 2021-12-29 ENCOUNTER — Other Ambulatory Visit (HOSPITAL_COMMUNITY): Payer: Self-pay | Admitting: Internal Medicine

## 2021-12-29 ENCOUNTER — Other Ambulatory Visit: Payer: Self-pay | Admitting: Internal Medicine

## 2021-12-29 DIAGNOSIS — F17200 Nicotine dependence, unspecified, uncomplicated: Secondary | ICD-10-CM | POA: Diagnosis not present

## 2021-12-29 DIAGNOSIS — R9389 Abnormal findings on diagnostic imaging of other specified body structures: Secondary | ICD-10-CM

## 2021-12-29 DIAGNOSIS — J449 Chronic obstructive pulmonary disease, unspecified: Secondary | ICD-10-CM | POA: Diagnosis not present

## 2021-12-29 DIAGNOSIS — N1831 Chronic kidney disease, stage 3a: Secondary | ICD-10-CM | POA: Diagnosis not present

## 2021-12-29 DIAGNOSIS — I10 Essential (primary) hypertension: Secondary | ICD-10-CM | POA: Diagnosis not present

## 2021-12-29 DIAGNOSIS — Z23 Encounter for immunization: Secondary | ICD-10-CM | POA: Diagnosis not present

## 2021-12-29 DIAGNOSIS — F17210 Nicotine dependence, cigarettes, uncomplicated: Secondary | ICD-10-CM | POA: Diagnosis not present

## 2022-01-09 DIAGNOSIS — I10 Essential (primary) hypertension: Secondary | ICD-10-CM | POA: Diagnosis not present

## 2022-01-09 DIAGNOSIS — Z1159 Encounter for screening for other viral diseases: Secondary | ICD-10-CM | POA: Diagnosis not present

## 2022-01-09 DIAGNOSIS — Z0001 Encounter for general adult medical examination with abnormal findings: Secondary | ICD-10-CM | POA: Diagnosis not present

## 2022-01-09 DIAGNOSIS — E559 Vitamin D deficiency, unspecified: Secondary | ICD-10-CM | POA: Diagnosis not present

## 2022-01-09 DIAGNOSIS — N1831 Chronic kidney disease, stage 3a: Secondary | ICD-10-CM | POA: Diagnosis not present

## 2022-01-29 DIAGNOSIS — N1831 Chronic kidney disease, stage 3a: Secondary | ICD-10-CM | POA: Diagnosis not present

## 2022-01-29 DIAGNOSIS — M109 Gout, unspecified: Secondary | ICD-10-CM | POA: Diagnosis not present

## 2022-03-01 DIAGNOSIS — I10 Essential (primary) hypertension: Secondary | ICD-10-CM | POA: Diagnosis not present

## 2022-03-01 DIAGNOSIS — N1831 Chronic kidney disease, stage 3a: Secondary | ICD-10-CM | POA: Diagnosis not present

## 2022-03-04 DIAGNOSIS — N1832 Chronic kidney disease, stage 3b: Secondary | ICD-10-CM | POA: Diagnosis not present

## 2022-03-04 DIAGNOSIS — I129 Hypertensive chronic kidney disease with stage 1 through stage 4 chronic kidney disease, or unspecified chronic kidney disease: Secondary | ICD-10-CM | POA: Diagnosis not present

## 2022-03-04 DIAGNOSIS — N170 Acute kidney failure with tubular necrosis: Secondary | ICD-10-CM | POA: Diagnosis not present

## 2022-03-04 DIAGNOSIS — D638 Anemia in other chronic diseases classified elsewhere: Secondary | ICD-10-CM | POA: Diagnosis not present

## 2022-03-04 DIAGNOSIS — R809 Proteinuria, unspecified: Secondary | ICD-10-CM | POA: Diagnosis not present

## 2022-03-04 DIAGNOSIS — E559 Vitamin D deficiency, unspecified: Secondary | ICD-10-CM | POA: Diagnosis not present

## 2022-03-07 DIAGNOSIS — I129 Hypertensive chronic kidney disease with stage 1 through stage 4 chronic kidney disease, or unspecified chronic kidney disease: Secondary | ICD-10-CM | POA: Diagnosis not present

## 2022-03-07 DIAGNOSIS — Z79899 Other long term (current) drug therapy: Secondary | ICD-10-CM | POA: Diagnosis not present

## 2022-03-07 DIAGNOSIS — E559 Vitamin D deficiency, unspecified: Secondary | ICD-10-CM | POA: Diagnosis not present

## 2022-03-07 DIAGNOSIS — N170 Acute kidney failure with tubular necrosis: Secondary | ICD-10-CM | POA: Diagnosis not present

## 2022-03-07 DIAGNOSIS — N1832 Chronic kidney disease, stage 3b: Secondary | ICD-10-CM | POA: Diagnosis not present

## 2022-03-07 DIAGNOSIS — D638 Anemia in other chronic diseases classified elsewhere: Secondary | ICD-10-CM | POA: Diagnosis not present

## 2022-03-31 DIAGNOSIS — I10 Essential (primary) hypertension: Secondary | ICD-10-CM | POA: Diagnosis not present

## 2022-03-31 DIAGNOSIS — M109 Gout, unspecified: Secondary | ICD-10-CM | POA: Diagnosis not present

## 2022-04-06 ENCOUNTER — Other Ambulatory Visit: Payer: Self-pay | Admitting: Nephrology

## 2022-04-06 DIAGNOSIS — N170 Acute kidney failure with tubular necrosis: Secondary | ICD-10-CM

## 2022-04-06 DIAGNOSIS — N1832 Chronic kidney disease, stage 3b: Secondary | ICD-10-CM

## 2022-04-07 DIAGNOSIS — D638 Anemia in other chronic diseases classified elsewhere: Secondary | ICD-10-CM | POA: Diagnosis not present

## 2022-04-07 DIAGNOSIS — N1832 Chronic kidney disease, stage 3b: Secondary | ICD-10-CM | POA: Diagnosis not present

## 2022-04-07 DIAGNOSIS — R809 Proteinuria, unspecified: Secondary | ICD-10-CM | POA: Diagnosis not present

## 2022-04-07 DIAGNOSIS — I129 Hypertensive chronic kidney disease with stage 1 through stage 4 chronic kidney disease, or unspecified chronic kidney disease: Secondary | ICD-10-CM | POA: Diagnosis not present

## 2022-04-07 DIAGNOSIS — E559 Vitamin D deficiency, unspecified: Secondary | ICD-10-CM | POA: Diagnosis not present

## 2022-04-10 DIAGNOSIS — N1832 Chronic kidney disease, stage 3b: Secondary | ICD-10-CM | POA: Diagnosis not present

## 2022-04-10 DIAGNOSIS — R778 Other specified abnormalities of plasma proteins: Secondary | ICD-10-CM | POA: Diagnosis not present

## 2022-04-10 DIAGNOSIS — I129 Hypertensive chronic kidney disease with stage 1 through stage 4 chronic kidney disease, or unspecified chronic kidney disease: Secondary | ICD-10-CM | POA: Diagnosis not present

## 2022-04-10 DIAGNOSIS — N170 Acute kidney failure with tubular necrosis: Secondary | ICD-10-CM | POA: Diagnosis not present

## 2022-04-10 DIAGNOSIS — D7589 Other specified diseases of blood and blood-forming organs: Secondary | ICD-10-CM | POA: Diagnosis not present

## 2022-04-10 DIAGNOSIS — D638 Anemia in other chronic diseases classified elsewhere: Secondary | ICD-10-CM | POA: Diagnosis not present

## 2022-04-10 DIAGNOSIS — E559 Vitamin D deficiency, unspecified: Secondary | ICD-10-CM | POA: Diagnosis not present

## 2022-04-10 DIAGNOSIS — R809 Proteinuria, unspecified: Secondary | ICD-10-CM | POA: Diagnosis not present

## 2022-04-12 ENCOUNTER — Ambulatory Visit (HOSPITAL_COMMUNITY)
Admission: RE | Admit: 2022-04-12 | Discharge: 2022-04-12 | Disposition: A | Discharge status: Home / Self Care | Payer: Medicare Other | Source: Ambulatory Visit | Attending: Nephrology | Admitting: Nephrology

## 2022-04-12 DIAGNOSIS — N189 Chronic kidney disease, unspecified: Secondary | ICD-10-CM | POA: Diagnosis not present

## 2022-04-12 DIAGNOSIS — N1832 Chronic kidney disease, stage 3b: Secondary | ICD-10-CM | POA: Diagnosis not present

## 2022-04-12 DIAGNOSIS — N170 Acute kidney failure with tubular necrosis: Secondary | ICD-10-CM | POA: Insufficient documentation

## 2022-04-25 DIAGNOSIS — I129 Hypertensive chronic kidney disease with stage 1 through stage 4 chronic kidney disease, or unspecified chronic kidney disease: Secondary | ICD-10-CM | POA: Diagnosis not present

## 2022-04-25 DIAGNOSIS — N170 Acute kidney failure with tubular necrosis: Secondary | ICD-10-CM | POA: Diagnosis not present

## 2022-04-25 DIAGNOSIS — N1832 Chronic kidney disease, stage 3b: Secondary | ICD-10-CM | POA: Diagnosis not present

## 2022-04-25 DIAGNOSIS — E559 Vitamin D deficiency, unspecified: Secondary | ICD-10-CM | POA: Diagnosis not present

## 2022-04-25 DIAGNOSIS — R778 Other specified abnormalities of plasma proteins: Secondary | ICD-10-CM | POA: Diagnosis not present

## 2022-04-27 DIAGNOSIS — I129 Hypertensive chronic kidney disease with stage 1 through stage 4 chronic kidney disease, or unspecified chronic kidney disease: Secondary | ICD-10-CM | POA: Diagnosis not present

## 2022-04-27 DIAGNOSIS — N170 Acute kidney failure with tubular necrosis: Secondary | ICD-10-CM | POA: Diagnosis not present

## 2022-04-27 DIAGNOSIS — D638 Anemia in other chronic diseases classified elsewhere: Secondary | ICD-10-CM | POA: Diagnosis not present

## 2022-04-27 DIAGNOSIS — N1831 Chronic kidney disease, stage 3a: Secondary | ICD-10-CM | POA: Diagnosis not present

## 2022-04-27 DIAGNOSIS — R809 Proteinuria, unspecified: Secondary | ICD-10-CM | POA: Diagnosis not present

## 2022-05-01 DIAGNOSIS — I10 Essential (primary) hypertension: Secondary | ICD-10-CM | POA: Diagnosis not present

## 2022-05-01 DIAGNOSIS — M109 Gout, unspecified: Secondary | ICD-10-CM | POA: Diagnosis not present

## 2022-05-31 DIAGNOSIS — I10 Essential (primary) hypertension: Secondary | ICD-10-CM | POA: Diagnosis not present

## 2022-05-31 DIAGNOSIS — N1831 Chronic kidney disease, stage 3a: Secondary | ICD-10-CM | POA: Diagnosis not present

## 2022-06-07 ENCOUNTER — Encounter: Payer: Self-pay | Admitting: *Deleted

## 2022-07-13 NOTE — Patient Instructions (Signed)
  Procedure: COLONOSCOPY  Estimated body mass index is 22.81 kg/m as calculated from the following:   Height as of this encounter: 5\' 8"  (1.727 m).   Weight as of this encounter: 150 lb (68 kg).   Have you had a colonoscopy before?  YES  Do you have family history of colon cancer?  YES  Do you have a family history of polyps? NO  Previous colonoscopy with polyps removed? YES  Do you have a history colorectal cancer?   NO  Are you diabetic?  NO  Do you have a prosthetic or mechanical heart valve? NO  Do you have a pacemaker/defibrillator?   NO  Have you had endocarditis/atrial fibrillation?  NO  Do you use supplemental oxygen/CPAP?  NO  Have you had joint replacement within the last 12 months?  NO  Do you tend to be constipated or have to use laxatives?  NO   Do you have history of alcohol use? If yes, how much and how often.  YES  Do you have history or are you using drugs? If yes, what do are you  using?  YES  Have you ever had a stroke/heart attack?  NO  Have you ever had a heart or other vascular stent placed? NO  Do you take weight loss medication? NO  male patients,: have you had a hysterectomy? N/A                              are you post menopausal?  N/A                              do you still have your menstrual cycle? N/A    Date of last menstrual period? N/A  Do you take any blood-thinning medications such as: (Plavix, aspirin, Coumadin, Aggrenox, Brilinta, Xarelto, Eliquis, Pradaxa, Savaysa or Effient)? NO  If yes we need the name, milligram, dosage and who is prescribing doctor:  N/A             Current Outpatient Medications  Medication Sig Dispense Refill   amLODipine (NORVASC) 10 MG tablet Take by mouth.     valsartan (DIOVAN) 40 MG tablet Take 40 mg by mouth daily.     No current facility-administered medications for this visit.    No Known Allergies

## 2022-07-14 DIAGNOSIS — J449 Chronic obstructive pulmonary disease, unspecified: Secondary | ICD-10-CM | POA: Diagnosis not present

## 2022-07-14 DIAGNOSIS — F17200 Nicotine dependence, unspecified, uncomplicated: Secondary | ICD-10-CM | POA: Diagnosis not present

## 2022-07-14 DIAGNOSIS — M109 Gout, unspecified: Secondary | ICD-10-CM | POA: Diagnosis not present

## 2022-07-14 DIAGNOSIS — R911 Solitary pulmonary nodule: Secondary | ICD-10-CM | POA: Diagnosis not present

## 2022-07-14 DIAGNOSIS — Z1389 Encounter for screening for other disorder: Secondary | ICD-10-CM | POA: Diagnosis not present

## 2022-07-14 DIAGNOSIS — E559 Vitamin D deficiency, unspecified: Secondary | ICD-10-CM | POA: Diagnosis not present

## 2022-07-14 DIAGNOSIS — F17210 Nicotine dependence, cigarettes, uncomplicated: Secondary | ICD-10-CM | POA: Diagnosis not present

## 2022-07-14 DIAGNOSIS — I10 Essential (primary) hypertension: Secondary | ICD-10-CM | POA: Diagnosis not present

## 2022-07-14 DIAGNOSIS — Z0001 Encounter for general adult medical examination with abnormal findings: Secondary | ICD-10-CM | POA: Diagnosis not present

## 2022-07-14 DIAGNOSIS — N1831 Chronic kidney disease, stage 3a: Secondary | ICD-10-CM | POA: Diagnosis not present

## 2022-07-14 DIAGNOSIS — Z23 Encounter for immunization: Secondary | ICD-10-CM | POA: Diagnosis not present

## 2022-07-18 ENCOUNTER — Other Ambulatory Visit (HOSPITAL_COMMUNITY): Payer: Self-pay | Admitting: Gerontology

## 2022-07-18 DIAGNOSIS — Z0001 Encounter for general adult medical examination with abnormal findings: Secondary | ICD-10-CM | POA: Diagnosis not present

## 2022-07-18 DIAGNOSIS — Z87891 Personal history of nicotine dependence: Secondary | ICD-10-CM

## 2022-07-18 DIAGNOSIS — I10 Essential (primary) hypertension: Secondary | ICD-10-CM | POA: Diagnosis not present

## 2022-07-18 DIAGNOSIS — E559 Vitamin D deficiency, unspecified: Secondary | ICD-10-CM | POA: Diagnosis not present

## 2022-07-18 DIAGNOSIS — Z1831 Retained animal quills or spines: Secondary | ICD-10-CM | POA: Diagnosis not present

## 2022-07-27 ENCOUNTER — Encounter: Payer: Self-pay | Admitting: Gastroenterology

## 2022-07-27 NOTE — Progress Notes (Signed)
.  ques

## 2022-07-27 NOTE — Progress Notes (Signed)
Please schedule per courtney. Thanks!

## 2022-07-27 NOTE — Progress Notes (Signed)
Needs OV given alcohol and drug use and CKD prior to scheduling.

## 2022-08-14 DIAGNOSIS — I10 Essential (primary) hypertension: Secondary | ICD-10-CM | POA: Diagnosis not present

## 2022-08-14 DIAGNOSIS — N1831 Chronic kidney disease, stage 3a: Secondary | ICD-10-CM | POA: Diagnosis not present
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
## 2022-08-22 ENCOUNTER — Ambulatory Visit: Payer: Medicare Other | Admitting: Gastroenterology

## 2022-08-22 ENCOUNTER — Encounter: Payer: Self-pay | Admitting: Gastroenterology

## 2022-08-22 VITALS — BP 133/84 | HR 69 | Temp 98.4°F | Ht 68.0 in | Wt 154.0 lb

## 2022-08-22 DIAGNOSIS — Z86010 Personal history of colonic polyps: Secondary | ICD-10-CM

## 2022-08-22 DIAGNOSIS — I10 Essential (primary) hypertension: Secondary | ICD-10-CM | POA: Diagnosis not present

## 2022-08-22 NOTE — Patient Instructions (Signed)
We will schedule you for colonoscopy in the near future with Dr. Abbey Chatters.  You received everything detailed written instructions regarding your prep.  It was a pleasure to see you today. I want to create trusting relationships with patients. If you receive a survey regarding your visit,  I greatly appreciate you taking time to fill this out on paper or through your MyChart. I value your feedback.  Venetia Night, MSN, FNP-BC, AGACNP-BC [facility]

## 2022-08-29 ENCOUNTER — Other Ambulatory Visit (HOSPITAL_COMMUNITY)
Admission: RE | Admit: 2022-08-29 | Discharge: 2022-08-29 | Disposition: A | Discharge status: Home / Self Care | Payer: Medicare Other | Source: Ambulatory Visit | Attending: Nephrology | Admitting: Nephrology

## 2022-08-29 DIAGNOSIS — R809 Proteinuria, unspecified: Secondary | ICD-10-CM | POA: Diagnosis not present

## 2022-08-29 DIAGNOSIS — N1831 Chronic kidney disease, stage 3a: Secondary | ICD-10-CM | POA: Insufficient documentation

## 2022-08-29 DIAGNOSIS — D638 Anemia in other chronic diseases classified elsewhere: Secondary | ICD-10-CM | POA: Diagnosis not present

## 2022-08-29 DIAGNOSIS — I129 Hypertensive chronic kidney disease with stage 1 through stage 4 chronic kidney disease, or unspecified chronic kidney disease: Secondary | ICD-10-CM | POA: Insufficient documentation

## 2022-08-29 DIAGNOSIS — N170 Acute kidney failure with tubular necrosis: Secondary | ICD-10-CM | POA: Insufficient documentation

## 2022-08-29 LAB — RENAL FUNCTION PANEL
Albumin: 4 g/dL (ref 3.5–5.0)
Anion gap: 8 (ref 5–15)
BUN: 18 mg/dL (ref 8–23)
CO2: 24 mmol/L (ref 22–32)
Calcium: 9.9 mg/dL (ref 8.9–10.3)
Chloride: 107 mmol/L (ref 98–111)
Creatinine, Ser: 1.59 mg/dL — ABNORMAL HIGH (ref 0.61–1.24)
GFR, Estimated: 47 mL/min — ABNORMAL LOW (ref >60)
Glucose, Bld: 102 mg/dL — ABNORMAL HIGH (ref 70–99)
Phosphorus: 3.3 mg/dL (ref 2.5–4.6)
Potassium: 3.4 mmol/L — ABNORMAL LOW (ref 3.5–5.1)
Sodium: 139 mmol/L (ref 135–145)

## 2022-08-29 LAB — CBC
HCT: 38.7 % — ABNORMAL LOW (ref 39.0–52.0)
Hemoglobin: 13.6 g/dL (ref 13.0–17.0)
MCH: 36.8 pg — ABNORMAL HIGH (ref 26.0–34.0)
MCHC: 35.1 g/dL (ref 30.0–36.0)
MCV: 104.6 fL — ABNORMAL HIGH (ref 80.0–100.0)
Platelets: 205 10*3/uL (ref 150–400)
RBC: 3.7 MIL/uL — ABNORMAL LOW (ref 4.22–5.81)
RDW: 12.5 % (ref 11.5–15.5)
WBC: 5.1 10*3/uL (ref 4.0–10.5)
nRBC: 0 % (ref 0.0–0.2)

## 2022-08-29 LAB — PROTEIN / CREATININE RATIO, URINE
Creatinine, Urine: 189 mg/dL
Protein Creatinine Ratio: 0.2 mg/mg{Cre} — ABNORMAL HIGH (ref 0.00–0.15)
Total Protein, Urine: 37 mg/dL

## 2022-08-29 LAB — IRON AND TIBC
Iron: 96 ug/dL (ref 45–182)
Saturation Ratios: 31 % (ref 17.9–39.5)
TIBC: 313 ug/dL (ref 250–450)
UIBC: 217 ug/dL

## 2022-08-29 LAB — FERRITIN: Ferritin: 113 ng/mL (ref 24–336)

## 2022-08-30 DIAGNOSIS — N281 Cyst of kidney, acquired: Secondary | ICD-10-CM | POA: Diagnosis not present

## 2022-08-30 DIAGNOSIS — I129 Hypertensive chronic kidney disease with stage 1 through stage 4 chronic kidney disease, or unspecified chronic kidney disease: Secondary | ICD-10-CM | POA: Diagnosis not present

## 2022-08-30 DIAGNOSIS — N1831 Chronic kidney disease, stage 3a: Secondary | ICD-10-CM | POA: Diagnosis not present

## 2022-08-30 DIAGNOSIS — E876 Hypokalemia: Secondary | ICD-10-CM | POA: Diagnosis not present

## 2022-08-30 DIAGNOSIS — R809 Proteinuria, unspecified: Secondary | ICD-10-CM | POA: Diagnosis not present

## 2022-08-31 LAB — PTH, INTACT AND CALCIUM
Calcium, Total (PTH): 10.3 mg/dL — ABNORMAL HIGH (ref 8.6–10.2)
PTH: 46 pg/mL (ref 15–65)

## 2022-09-01 ENCOUNTER — Telehealth (INDEPENDENT_AMBULATORY_CARE_PROVIDER_SITE_OTHER): Payer: Self-pay | Admitting: *Deleted

## 2022-09-01 MED ORDER — PEG 3350-KCL-NA BICARB-NACL 420 G PO SOLR: 4000.0000 mL | Freq: Once | ORAL | 0 refills | Status: AC

## 2022-09-01 NOTE — Telephone Encounter (Signed)
Spoke with pt. Scheduled TCS with Dr. Abbey Chatters, ASA 3 on 4/22. Aware will send instructions/pre-op appt. Per encounter form needs trilyte  Notification or Prior Authorization is not required for the requested services Decision ID #: [id number]

## 2022-09-04 ENCOUNTER — Encounter (INDEPENDENT_AMBULATORY_CARE_PROVIDER_SITE_OTHER): Payer: Self-pay | Admitting: *Deleted

## 2022-09-06 ENCOUNTER — Ambulatory Visit (HOSPITAL_COMMUNITY): Admission: RE | Admit: 2022-09-06 | Payer: Medicare Other | Source: Ambulatory Visit

## 2022-09-06 ENCOUNTER — Encounter (HOSPITAL_COMMUNITY): Payer: Self-pay

## 2022-09-06 ENCOUNTER — Ambulatory Visit (HOSPITAL_COMMUNITY)
Admission: RE | Admit: 2022-09-06 | Discharge: 2022-09-06 | Disposition: A | Discharge status: Home / Self Care | Payer: Medicare Other | Source: Ambulatory Visit | Attending: Gerontology | Admitting: Gerontology

## 2022-09-06 DIAGNOSIS — Z87891 Personal history of nicotine dependence: Secondary | ICD-10-CM

## 2022-09-06 DIAGNOSIS — F17210 Nicotine dependence, cigarettes, uncomplicated: Secondary | ICD-10-CM | POA: Diagnosis not present

## 2022-09-12 DIAGNOSIS — I10 Essential (primary) hypertension: Secondary | ICD-10-CM | POA: Diagnosis not present

## 2022-09-12 DIAGNOSIS — N1831 Chronic kidney disease, stage 3a: Secondary | ICD-10-CM | POA: Diagnosis not present

## 2022-10-09 NOTE — Patient Instructions (Signed)
   Your procedure is scheduled on: 10/16/2022  Report to [facility] Main Entrance at    6:45 AM.  Call this number if you have problems the morning of surgery: [phone]   Remember:              Follow Directions on the letter you received from Your Physician's office regarding the Bowel Prep              No Smoking the day of Procedure :   Take these medicines the morning of surgery with A SIP OF WATER: Amlodipine   Do not wear jewelry, make-up or nail polish.    Do not bring valuables to the hospital.  Contacts, dentures or bridgework may not be worn into surgery.  .   Patients discharged the day of surgery will not be allowed to drive home.     Colonoscopy, Adult, Care After This sheet gives you information about how to care for yourself after your procedure. Your health care provider may also give you more specific instructions. If you have problems or questions, contact your health care provider. What can I expect after the procedure? After the procedure, it is common to have: A small amount of blood in your stool for 24 hours after the procedure. Some gas. Mild abdominal cramping or bloating.  Follow these instructions at home: General instructions  For the first 24 hours after the procedure: Do not drive or use machinery. Do not sign important documents. Do not drink alcohol. Do your regular daily activities at a slower pace than normal. Eat soft, easy-to-digest foods. Rest often. Take over-the-counter or prescription medicines only as told by your health care provider. It is up to you to get the results of your procedure. Ask your health care provider, or the department performing the procedure, when your results will be ready. Relieving cramping and bloating Try walking around when you have cramps or feel bloated. Apply heat to your abdomen as told by your health care provider. Use a heat source that your health care provider recommends, such as a moist heat pack  or a heating pad. Place a towel between your skin and the heat source. Leave the heat on for 20-30 minutes. Remove the heat if your skin turns bright red. This is especially important if you are unable to feel pain, heat, or cold. You may have a greater risk of getting burned. Eating and drinking Drink enough fluid to keep your urine clear or pale yellow. Resume your normal diet as instructed by your health care provider. Avoid heavy or fried foods that are hard to digest. Avoid drinking alcohol for as long as instructed by your health care provider. Contact a health care provider if: You have blood in your stool 2-3 days after the procedure. Get help right away if: You have more than a small spotting of blood in your stool. You pass large blood clots in your stool. Your abdomen is swollen. You have nausea or vomiting. You have a fever. You have increasing abdominal pain that is not relieved with medicine. This information is not intended to replace advice given to you by your health care provider. Make sure you discuss any questions you have with your health care provider. Document Released: 01/25/2004 Document Revised: 03/06/2016 Document Reviewed: 08/24/2015 Elsevier Interactive Patient Education  [employer].

## 2022-10-11 ENCOUNTER — Encounter (HOSPITAL_COMMUNITY)
Admission: RE | Admit: 2022-10-11 | Discharge: 2022-10-11 | Disposition: A | Discharge status: Home / Self Care | Payer: Medicare Other | Source: Ambulatory Visit | Attending: Internal Medicine | Admitting: Internal Medicine

## 2022-10-11 ENCOUNTER — Other Ambulatory Visit: Payer: Self-pay

## 2022-10-11 ENCOUNTER — Encounter (HOSPITAL_COMMUNITY): Payer: Self-pay

## 2022-10-11 VITALS — BP 124/70 | HR 90 | Temp 97.8°F | Resp 18 | Ht 68.0 in | Wt 154.1 lb

## 2022-10-11 DIAGNOSIS — I10 Essential (primary) hypertension: Secondary | ICD-10-CM

## 2022-10-11 DIAGNOSIS — Z01818 Encounter for other preprocedural examination: Secondary | ICD-10-CM | POA: Diagnosis not present

## 2022-10-11 NOTE — Progress Notes (Signed)
PAT visit completed. Pt verbalized understanding of prep instructions and arrival time.

## 2022-10-13 DIAGNOSIS — N1831 Chronic kidney disease, stage 3a: Secondary | ICD-10-CM | POA: Diagnosis not present

## 2022-10-13 DIAGNOSIS — I10 Essential (primary) hypertension: Secondary | ICD-10-CM | POA: Diagnosis not present

## 2022-10-16 ENCOUNTER — Ambulatory Visit (HOSPITAL_BASED_OUTPATIENT_CLINIC_OR_DEPARTMENT_OTHER): Payer: Medicare Other | Admitting: Anesthesiology

## 2022-10-16 ENCOUNTER — Ambulatory Visit (HOSPITAL_COMMUNITY)
Admission: RE | Admit: 2022-10-16 | Discharge: 2022-10-16 | Disposition: A | Discharge status: Home / Self Care | Payer: Medicare Other | Attending: Internal Medicine | Admitting: Internal Medicine

## 2022-10-16 ENCOUNTER — Encounter (HOSPITAL_COMMUNITY)
Admission: RE | Disposition: A | Discharge status: Home / Self Care | Payer: Self-pay | Source: Home / Self Care | Attending: Internal Medicine

## 2022-10-16 ENCOUNTER — Ambulatory Visit (HOSPITAL_COMMUNITY): Payer: Medicare Other | Admitting: Anesthesiology

## 2022-10-16 ENCOUNTER — Other Ambulatory Visit: Payer: Self-pay

## 2022-10-16 ENCOUNTER — Encounter (HOSPITAL_COMMUNITY): Payer: Self-pay

## 2022-10-16 DIAGNOSIS — F17210 Nicotine dependence, cigarettes, uncomplicated: Secondary | ICD-10-CM

## 2022-10-16 DIAGNOSIS — Z1211 Encounter for screening for malignant neoplasm of colon: Secondary | ICD-10-CM | POA: Insufficient documentation

## 2022-10-16 DIAGNOSIS — D124 Benign neoplasm of descending colon: Secondary | ICD-10-CM

## 2022-10-16 DIAGNOSIS — M1990 Unspecified osteoarthritis, unspecified site: Secondary | ICD-10-CM | POA: Diagnosis not present

## 2022-10-16 DIAGNOSIS — Z86718 Personal history of other venous thrombosis and embolism: Secondary | ICD-10-CM | POA: Insufficient documentation

## 2022-10-16 DIAGNOSIS — I10 Essential (primary) hypertension: Secondary | ICD-10-CM | POA: Insufficient documentation

## 2022-10-16 DIAGNOSIS — K648 Other hemorrhoids: Secondary | ICD-10-CM | POA: Diagnosis not present

## 2022-10-16 DIAGNOSIS — N289 Disorder of kidney and ureter, unspecified: Secondary | ICD-10-CM | POA: Diagnosis not present

## 2022-10-16 DIAGNOSIS — N189 Chronic kidney disease, unspecified: Secondary | ICD-10-CM | POA: Diagnosis not present

## 2022-10-16 DIAGNOSIS — K635 Polyp of colon: Secondary | ICD-10-CM | POA: Diagnosis not present

## 2022-10-16 DIAGNOSIS — K5730 Diverticulosis of large intestine without perforation or abscess without bleeding: Secondary | ICD-10-CM

## 2022-10-16 DIAGNOSIS — Z79899 Other long term (current) drug therapy: Secondary | ICD-10-CM | POA: Insufficient documentation

## 2022-10-16 DIAGNOSIS — I129 Hypertensive chronic kidney disease with stage 1 through stage 4 chronic kidney disease, or unspecified chronic kidney disease: Secondary | ICD-10-CM | POA: Diagnosis not present

## 2022-10-16 DIAGNOSIS — Z86010 Personal history of colonic polyps: Secondary | ICD-10-CM | POA: Diagnosis not present

## 2022-10-16 HISTORY — PX: POLYPECTOMY: SHX5525

## 2022-10-16 HISTORY — PX: COLONOSCOPY WITH PROPOFOL: SHX5780

## 2022-10-16 SURGERY — COLONOSCOPY WITH PROPOFOL
Anesthesia: General

## 2022-10-16 MED ORDER — LACTATED RINGERS IV SOLN: INTRAVENOUS | Status: DC

## 2022-10-16 MED ORDER — PROPOFOL 500 MG/50ML IV EMUL
INTRAVENOUS | Status: DC | PRN
  Administered 2022-10-16: 125 ug/kg/min via INTRAVENOUS

## 2022-10-16 MED ORDER — PROPOFOL 10 MG/ML IV BOLUS
INTRAVENOUS | Status: DC | PRN
  Administered 2022-10-16: 20 mg via INTRAVENOUS
  Administered 2022-10-16: 200 mg via INTRAVENOUS

## 2022-10-16 MED ORDER — STERILE WATER FOR IRRIGATION IR SOLN
Status: DC | PRN
  Administered 2022-10-16: .6 mL

## 2022-10-16 NOTE — Anesthesia Postprocedure Evaluation (Signed)
Anesthesia Post Note  Patient: Brian Heath  Procedure(s) Performed: COLONOSCOPY WITH PROPOFOL POLYPECTOMY  Patient location during evaluation: Phase II Anesthesia Type: General Level of consciousness: awake and alert and oriented Pain management: pain level controlled Vital Signs Assessment: post-procedure vital signs reviewed and stable Respiratory status: spontaneous breathing, nonlabored ventilation and respiratory function stable Cardiovascular status: blood pressure returned to baseline and stable Postop Assessment: no apparent nausea or vomiting Anesthetic complications: no  No notable events documented.   Last Vitals:  Vitals:   10/16/22 0719 10/16/22 0824  BP: 130/83 95/61  Pulse: 64 76  Resp: 18 13  Temp: 37.2 C 36.6 C  SpO2: 100% 97%    Last Pain:  Vitals:   10/16/22 0824  TempSrc: Oral  PainSc:                  Zynia Wojtowicz C Dove Gresham

## 2022-10-16 NOTE — Op Note (Signed)
[facility] Patient Name: Brian Heath Procedure Date: 10/16/2022 7:49 AM MRN: [id number] Date of Birth: February 27, 1951 Attending MD: Hennie Duos. Marletta Lor , [place], [phone] CSN: [id number] Age: 72 Admit Type: Outpatient Procedure:                Colonoscopy Indications:              Surveillance: Personal history of adenomatous                            polyps on last colonoscopy 3 years ago Providers:                Hennie Duos. Marletta Lor, DO, Jannett Celestine, RN, Angelica Ran,                            Lennice Sites Technician, Technician, Pandora Leiter,                            Technician Referring MD:             Hennie Duos. Marletta Lor, DO Medicines:                See the Anesthesia note for documentation of the                            administered medications Complications:            No immediate complications. Estimated Blood Loss:     Estimated blood loss: none. Estimated blood loss                            was minimal. Procedure:                Pre-Anesthesia Assessment:                           - The anesthesia plan was to use monitored                            anesthesia care (MAC).                           After obtaining informed consent, the colonoscope                            was passed under direct vision. Throughout the                            procedure, the patient's blood pressure, pulse, and                            oxygen saturations were monitored continuously. The                            [id number]-HQ190L ([id number]) scope was introduced through                            the anus and advanced to the the  cecum, identified                            by appendiceal orifice and ileocecal valve. The                            colonoscopy was somewhat difficult due to a                            redundant colon and significant looping. Successful                            completion of the procedure was aided by applying                            abdominal pressure. The  patient tolerated the                            procedure well. The quality of the bowel                            preparation was evaluated using the BBPS [facility]                            Bowel Preparation Scale) with scores of: Right                            Colon = 2 (minor amount of residual staining, small                            fragments of stool and/or opaque liquid, but mucosa                            seen well), Transverse Colon = 3 (entire mucosa                            seen well with no residual staining, small                            fragments of stool or opaque liquid) and Left Colon                            = 2 (minor amount of residual staining, small                            fragments of stool and/or opaque liquid, but mucosa                            seen well). The total BBPS score equals 7. Fair. Scope In: 8:05:18 AM Scope Out: 8:21:52 AM Scope Withdrawal Time: 0 hours 12 minutes 31 seconds  Total Procedure Duration: 0 hours 16 minutes 34 seconds  Findings:      Non-bleeding internal hemorrhoids were found during endoscopy.  Multiple large-mouthed and small-mouthed diverticula were found in the       entire colon.      A 10 mm polyp was found in the descending colon. The polyp was       pedunculated. The polyp was removed with a cold snare. Resection and       retrieval were complete.      The exam was otherwise without abnormality. Impression:               - Non-bleeding internal hemorrhoids.                           - Diverticulosis in the entire examined colon.                           - One 10 mm polyp in the descending colon, removed                            with a cold snare. Resected and retrieved.                           - The examination was otherwise normal. Moderate Sedation:      Per Anesthesia Care Recommendation:           - Patient has a contact number available for                            emergencies. The signs  and symptoms of potential                            delayed complications were discussed with the                            patient. Return to normal activities tomorrow.                            Written discharge instructions were provided to the                            patient.                           - Resume previous diet.                           - Continue present medications.                           - Await pathology results.                           - Repeat colonoscopy in 3 years for surveillance.                           - Return to GI clinic PRN. Procedure Code(s):        --- Professional ---  62130, Colonoscopy, flexible; with removal of                            tumor(s), polyp(s), or other lesion(s) by snare                            technique Diagnosis Code(s):        --- Professional ---                           Z86.010, Personal history of colonic polyps                           K64.8, Other hemorrhoids                           D12.4, Benign neoplasm of descending colon                           K57.30, Diverticulosis of large intestine without                            perforation or abscess without bleeding CPT copyright 2022 American Medical Association. All rights reserved. The codes documented in this report are preliminary and upon coder review may  be revised to meet current compliance requirements. Hennie Duos. Marletta Lor, DO Hennie Duos. Marletta Lor, DO 10/16/2022 8:25:11 AM This report has been signed electronically. Number of Addenda: 0

## 2022-10-16 NOTE — Anesthesia Procedure Notes (Signed)
Date/Time: 10/16/2022 8:00 AM  Performed by: Franco Nones, CRNAPre-anesthesia Checklist: Patient identified, Emergency Drugs available, Suction available, Timeout performed and Patient being monitored Patient Re-evaluated:Patient Re-evaluated prior to induction Oxygen Delivery Method: Nasal Cannula

## 2022-10-16 NOTE — Discharge Instructions (Addendum)
  Colonoscopy Discharge Instructions  Read the instructions outlined below and refer to this sheet in the next few weeks. These discharge instructions provide you with general information on caring for yourself after you leave the hospital. Your doctor may also give you specific instructions. While your treatment has been planned according to the most current medical practices available, unavoidable complications occasionally occur.   ACTIVITY You may resume your regular activity, but move at a slower pace for the next 24 hours.  Take frequent rest periods for the next 24 hours.  Walking will help get rid of the air and reduce the bloated feeling in your belly (abdomen).  No driving for 24 hours (because of the medicine (anesthesia) used during the test).   Do not sign any important legal documents or operate any machinery for 24 hours (because of the anesthesia used during the test).  NUTRITION Drink plenty of fluids.  You may resume your normal diet as instructed by your doctor.  Begin with a light meal and progress to your normal diet. Heavy or fried foods are harder to digest and may make you feel sick to your stomach (nauseated).  Avoid alcoholic beverages for 24 hours or as instructed.  MEDICATIONS You may resume your normal medications unless your doctor tells you otherwise.  WHAT YOU CAN EXPECT TODAY Some feelings of bloating in the abdomen.  Passage of more gas than usual.  Spotting of blood in your stool or on the toilet paper.  IF YOU HAD POLYPS REMOVED DURING THE COLONOSCOPY: No aspirin products for 7 days or as instructed.  No alcohol for 7 days or as instructed.  Eat a soft diet for the next 24 hours.  FINDING OUT THE RESULTS OF YOUR TEST Not all test results are available during your visit. If your test results are not back during the visit, make an appointment with your caregiver to find out the results. Do not assume everything is normal if you have not heard from your  caregiver or the medical facility. It is important for you to follow up on all of your test results.  SEEK IMMEDIATE MEDICAL ATTENTION IF: You have more than a spotting of blood in your stool.  Your belly is swollen (abdominal distention).  You are nauseated or vomiting.  You have a temperature over 101.  You have abdominal pain or discomfort that is severe or gets worse throughout the day.   Your colonoscopy revealed 1 medium sized polyp(s) which I removed successfully. Await pathology results, my office will contact you. I recommend repeating colonoscopy in  years for surveillance purposes.   You also have diverticulosis and internal hemorrhoids. I would recommend increasing fiber in your diet or adding OTC Benefiber/Metamucil. Be sure to drink at least 4 to 6 glasses of water daily. Follow-up with GI as needed.   I hope you have a great rest of your week!  Hennie Duos. Marletta Lor, D.O. Gastroenterology and Hepatology [facility]

## 2022-10-16 NOTE — Anesthesia Preprocedure Evaluation (Addendum)
Anesthesia Evaluation  Patient identified by MRN, date of birth, ID band Patient awake    Reviewed: Allergy & Precautions, H&P , NPO status , Patient's Chart, lab work & pertinent test results  Airway Mallampati: I  TM Distance: >3 FB Neck ROM: Full    Dental  (+) Edentulous Upper, Edentulous Lower   Pulmonary Current Smoker and Patient abstained from smoking.   Pulmonary exam normal breath sounds clear to auscultation       Cardiovascular Exercise Tolerance: Good hypertension, Pt. on medications + DVT  Normal cardiovascular exam Rhythm:Irregular Rate:Normal  11-Oct-2022 08:58:28 [facility] ROUTINE RECORD Mar 02, 1951 (72 yr) Male Black Vent. rate 73 BPM PR interval 152 ms QRS duration 94 ms QT/QTcB 400/440 ms P-R-T axes 34 -69 -23 Sinus rhythm with Premature supraventricular complexes in a pattern of bigeminy Left axis deviation Abnormal ECG No previous ECGs available Confirmed by Carylon Perches [phone]) on 10/11/2022 7:13:17 PM   Neuro/Psych negative neurological ROS  negative psych ROS   GI/Hepatic negative GI ROS,,,(+)     substance abuse  alcohol use and marijuana use  Endo/Other  negative endocrine ROS    Renal/GU Renal InsufficiencyRenal disease  negative genitourinary   Musculoskeletal  (+) Arthritis  (gout),    Abdominal   Peds negative pediatric ROS (+)  Hematology negative hematology ROS (+)   Anesthesia Other Findings   Reproductive/Obstetrics negative OB ROS                             Anesthesia Physical Anesthesia Plan  ASA: 2  Anesthesia Plan: General   Post-op Pain Management: Minimal or no pain anticipated   Induction: Intravenous  PONV Risk Score and Plan: 1 and Propofol infusion  Airway Management Planned: Nasal Cannula and Natural Airway  Additional Equipment:   Intra-op Plan:   Post-operative Plan:   Informed Consent: I have  reviewed the patients History and Physical, chart, labs and discussed the procedure including the risks, benefits and alternatives for the proposed anesthesia with the patient or authorized representative who has indicated his/her understanding and acceptance.       Plan Discussed with: CRNA and Surgeon  Anesthesia Plan Comments:         Anesthesia Quick Evaluation

## 2022-10-16 NOTE — Transfer of Care (Signed)
Immediate Anesthesia Transfer of Care Note  Patient: Brian Heath  Procedure(s) Performed: COLONOSCOPY WITH PROPOFOL POLYPECTOMY  Patient Location: Short Stay  Anesthesia Type:General  Level of Consciousness: awake and patient cooperative  Airway & Oxygen Therapy: Patient Spontanous Breathing  Post-op Assessment: Report given to RN and Post -op Vital signs reviewed and stable  Post vital signs: Reviewed and stable  Last Vitals:  Vitals Value Taken Time  BP 95/61 0833  Temp 97.9 0833  Pulse 80 0833  Resp 24 0833  SpO2 96 0833    Last Pain:  Vitals:   10/16/22 0824  TempSrc: Oral  PainSc:       Patients Stated Pain Goal: Other (Comment) (denies ever having pain) (10/16/22 0705)  Complications: No notable events documented.

## 2022-10-16 NOTE — H&P (Signed)
Primary Care Physician:  Benetta Spar, MD Primary Gastroenterologist:  Dr. Marletta Lor  Pre-Procedure History & Physical: HPI:  Brian Heath is a 72 y.o. male is here for a colonoscopy to be performed for surveillance purposes, personal history of adenomatous colon polyps in 2021  Past Medical History:  Diagnosis Date   DVT, lower extremity    Left popliteal   Gout    Hypertension     Past Surgical History:  Procedure Laterality Date   APPENDECTOMY  1980   COLONOSCOPY N/A 03/30/2014   Surgeon: West Bali, MD; 3 colon polyps, moderate diverticulosis throughout the entire colon, large internal hemorrhoids.   COLONOSCOPY WITH PROPOFOL N/A 07/08/2019   Procedure: COLONOSCOPY WITH PROPOFOL;  Surgeon: West Bali, MD;  Location: [facility];  Service: Endoscopy;  Laterality: N/A;  2:30pm   LAPAROSCOPIC SMALL BOWEL RESECTION  2018   POLYPECTOMY  07/08/2019   Procedure: POLYPECTOMY;  Surgeon: West Bali, MD;  Location: [facility];  Service: Endoscopy;;    Prior to Admission medications   Medication Sig Start Date End Date Taking? Authorizing Provider  amLODipine (NORVASC) 10 MG tablet Take 10 mg by mouth daily. 03/03/22  Yes [provider]  Potassium Chloride ER 20 MEQ TBCR Take 20 mEq by mouth daily. 09/01/22  Yes [provider]  valsartan (DIOVAN) 80 MG tablet Take 40 mg by mouth daily.   Yes [provider]  allopurinol (ZYLOPRIM) 300 MG tablet Take 300 mg by mouth daily. 10/15/16   [provider]    Allergies as of 09/01/2022   (No Known Allergies)    Family History  Problem Relation Age of Onset   Colon cancer Neg Hx    Colon polyps Neg Hx     Social History   Socioeconomic History   Marital status: Widowed    Spouse name: Not on file   Number of children: Not on file   Years of education: Not on file   Highest education level: Not on file  Occupational History   Not on file  Tobacco Use   Smoking status: Every  Day    Packs/day: 1.00    Years: 35.00    Additional pack years: 0.00    Total pack years: 35.00    Types: Cigars, Cigarettes   Smokeless tobacco: Never   Tobacco comments:    Current consumption less than 1/2 pack per day  Vaping Use   Vaping Use: Never used  Substance and Sexual Activity   Alcohol use: Yes    Alcohol/week: 10.0 standard drinks of alcohol    Types: 10 Standard drinks or equivalent per week    Comment: 1/2 pint brandy a day.    Drug use: Yes    Frequency: 1.0 times per week    Types: Marijuana    Comment: Daily.    Sexual activity: Not on file  Other Topics Concern   Not on file  Social History Narrative   Not on file   Social Determinants of Health   Financial Resource Strain: Not on file  Food Insecurity: Not on file  Transportation Needs: Not on file  Physical Activity: Not on file  Stress: Not on file  Social Connections: Not on file  Intimate Partner Violence: Not on file    Review of Systems: See HPI, otherwise negative ROS  Physical Exam: Vital signs in last 24 hours: Temp:  [98.9 F (37.2 C)] 98.9 F (37.2 C) (04/22 0719) Pulse Rate:  [64]  64 (04/22 0719) Resp:  [18] 18 (04/22 0719) BP: (130)/(83) 130/83 (04/22 0719) SpO2:  [100 %] 100 % (04/22 0719)   General:   Alert,  Well-developed, well-nourished, pleasant and cooperative in NAD Head:  Normocephalic and atraumatic. Eyes:  Sclera clear, no icterus.   Conjunctiva pink. Ears:  Normal auditory acuity. Nose:  No deformity, discharge,  or lesions. Msk:  Symmetrical without gross deformities. Normal posture. Extremities:  Without clubbing or edema. Neurologic:  Alert and  oriented x4;  grossly normal neurologically. Skin:  Intact without significant lesions or rashes. Psych:  Alert and cooperative. Normal mood and affect.  Impression/Plan: Brian Heath is here for a colonoscopy to be performed for surveillance purposes, personal history of adenomatous colon polyps in 2021  The  risks of the procedure including infection, bleed, or perforation as well as benefits, limitations, alternatives and imponderables have been reviewed with the patient. Questions have been answered. All parties agreeable.

## 2022-10-17 LAB — SURGICAL PATHOLOGY

## 2022-10-20 ENCOUNTER — Encounter (HOSPITAL_COMMUNITY): Payer: Self-pay | Admitting: Internal Medicine

## 2022-11-12 DIAGNOSIS — I10 Essential (primary) hypertension: Secondary | ICD-10-CM | POA: Diagnosis not present

## 2022-11-12 DIAGNOSIS — N1831 Chronic kidney disease, stage 3a: Secondary | ICD-10-CM | POA: Diagnosis not present

## 2022-11-28 DIAGNOSIS — E211 Secondary hyperparathyroidism, not elsewhere classified: Secondary | ICD-10-CM | POA: Diagnosis not present

## 2022-11-28 DIAGNOSIS — R809 Proteinuria, unspecified: Secondary | ICD-10-CM | POA: Diagnosis not present

## 2022-11-28 DIAGNOSIS — I129 Hypertensive chronic kidney disease with stage 1 through stage 4 chronic kidney disease, or unspecified chronic kidney disease: Secondary | ICD-10-CM | POA: Diagnosis not present

## 2022-11-28 DIAGNOSIS — N1831 Chronic kidney disease, stage 3a: Secondary | ICD-10-CM | POA: Diagnosis not present

## 2022-11-28 DIAGNOSIS — D638 Anemia in other chronic diseases classified elsewhere: Secondary | ICD-10-CM | POA: Diagnosis not present

## 2022-12-04 DIAGNOSIS — D7589 Other specified diseases of blood and blood-forming organs: Secondary | ICD-10-CM | POA: Diagnosis not present

## 2022-12-04 DIAGNOSIS — R031 Nonspecific low blood-pressure reading: Secondary | ICD-10-CM | POA: Diagnosis not present

## 2022-12-13 DIAGNOSIS — N1831 Chronic kidney disease, stage 3a: Secondary | ICD-10-CM | POA: Diagnosis not present

## 2022-12-13 DIAGNOSIS — I10 Essential (primary) hypertension: Secondary | ICD-10-CM | POA: Diagnosis not present

## 2023-01-05 DIAGNOSIS — J449 Chronic obstructive pulmonary disease, unspecified: Secondary | ICD-10-CM | POA: Diagnosis not present

## 2023-01-05 DIAGNOSIS — R911 Solitary pulmonary nodule: Secondary | ICD-10-CM | POA: Diagnosis not present

## 2023-01-05 DIAGNOSIS — F17210 Nicotine dependence, cigarettes, uncomplicated: Secondary | ICD-10-CM | POA: Diagnosis not present

## 2023-01-05 DIAGNOSIS — N1831 Chronic kidney disease, stage 3a: Secondary | ICD-10-CM | POA: Diagnosis not present

## 2023-01-05 DIAGNOSIS — I10 Essential (primary) hypertension: Secondary | ICD-10-CM | POA: Diagnosis not present

## 2023-01-05 DIAGNOSIS — F17200 Nicotine dependence, unspecified, uncomplicated: Secondary | ICD-10-CM | POA: Diagnosis not present

## 2023-02-05 DIAGNOSIS — N189 Chronic kidney disease, unspecified: Secondary | ICD-10-CM | POA: Diagnosis not present

## 2023-02-05 DIAGNOSIS — N1831 Chronic kidney disease, stage 3a: Secondary | ICD-10-CM | POA: Diagnosis not present

## 2023-02-05 DIAGNOSIS — I10 Essential (primary) hypertension: Secondary | ICD-10-CM | POA: Diagnosis not present

## 2023-02-05 DIAGNOSIS — R809 Proteinuria, unspecified: Secondary | ICD-10-CM | POA: Diagnosis not present

## 2023-02-13 DIAGNOSIS — R809 Proteinuria, unspecified: Secondary | ICD-10-CM | POA: Diagnosis not present

## 2023-02-13 DIAGNOSIS — N1831 Chronic kidney disease, stage 3a: Secondary | ICD-10-CM | POA: Diagnosis not present

## 2023-02-13 DIAGNOSIS — E559 Vitamin D deficiency, unspecified: Secondary | ICD-10-CM | POA: Diagnosis not present

## 2023-03-08 DIAGNOSIS — N1831 Chronic kidney disease, stage 3a: Secondary | ICD-10-CM | POA: Diagnosis not present

## 2023-03-08 DIAGNOSIS — I10 Essential (primary) hypertension: Secondary | ICD-10-CM | POA: Diagnosis not present

## 2023-04-07 DIAGNOSIS — I10 Essential (primary) hypertension: Secondary | ICD-10-CM | POA: Diagnosis not present

## 2023-04-07 DIAGNOSIS — N1831 Chronic kidney disease, stage 3a: Secondary | ICD-10-CM | POA: Diagnosis not present

## 2023-05-08 DIAGNOSIS — N1831 Chronic kidney disease, stage 3a: Secondary | ICD-10-CM | POA: Diagnosis not present

## 2023-05-08 DIAGNOSIS — I10 Essential (primary) hypertension: Secondary | ICD-10-CM | POA: Diagnosis not present

## 2023-05-16 DIAGNOSIS — N189 Chronic kidney disease, unspecified: Secondary | ICD-10-CM | POA: Diagnosis not present

## 2023-05-16 DIAGNOSIS — E559 Vitamin D deficiency, unspecified: Secondary | ICD-10-CM | POA: Diagnosis not present

## 2023-05-16 DIAGNOSIS — R809 Proteinuria, unspecified: Secondary | ICD-10-CM | POA: Diagnosis not present

## 2023-05-16 DIAGNOSIS — N1831 Chronic kidney disease, stage 3a: Secondary | ICD-10-CM | POA: Diagnosis not present

## 2023-05-23 DIAGNOSIS — R809 Proteinuria, unspecified: Secondary | ICD-10-CM | POA: Diagnosis not present

## 2023-05-23 DIAGNOSIS — N1831 Chronic kidney disease, stage 3a: Secondary | ICD-10-CM | POA: Diagnosis not present

## 2023-06-07 DIAGNOSIS — I10 Essential (primary) hypertension: Secondary | ICD-10-CM | POA: Diagnosis not present

## 2023-06-07 DIAGNOSIS — N1831 Chronic kidney disease, stage 3a: Secondary | ICD-10-CM | POA: Diagnosis not present

## 2023-07-09 ENCOUNTER — Other Ambulatory Visit (HOSPITAL_COMMUNITY): Payer: Self-pay | Admitting: Gerontology

## 2023-07-09 DIAGNOSIS — Z1389 Encounter for screening for other disorder: Secondary | ICD-10-CM | POA: Diagnosis not present

## 2023-07-09 DIAGNOSIS — J449 Chronic obstructive pulmonary disease, unspecified: Secondary | ICD-10-CM | POA: Diagnosis not present

## 2023-07-09 DIAGNOSIS — Z0001 Encounter for general adult medical examination with abnormal findings: Secondary | ICD-10-CM | POA: Diagnosis not present

## 2023-07-09 DIAGNOSIS — Z87891 Personal history of nicotine dependence: Secondary | ICD-10-CM

## 2023-07-09 DIAGNOSIS — Z7189 Other specified counseling: Secondary | ICD-10-CM | POA: Diagnosis not present

## 2023-07-09 DIAGNOSIS — F17210 Nicotine dependence, cigarettes, uncomplicated: Secondary | ICD-10-CM | POA: Diagnosis not present

## 2023-07-09 DIAGNOSIS — F17200 Nicotine dependence, unspecified, uncomplicated: Secondary | ICD-10-CM | POA: Diagnosis not present

## 2023-07-09 DIAGNOSIS — I10 Essential (primary) hypertension: Secondary | ICD-10-CM | POA: Diagnosis not present

## 2023-07-09 DIAGNOSIS — R911 Solitary pulmonary nodule: Secondary | ICD-10-CM | POA: Diagnosis not present

## 2023-07-09 DIAGNOSIS — M109 Gout, unspecified: Secondary | ICD-10-CM | POA: Diagnosis not present

## 2023-08-09 DIAGNOSIS — N1831 Chronic kidney disease, stage 3a: Secondary | ICD-10-CM | POA: Diagnosis not present

## 2023-08-09 DIAGNOSIS — I10 Essential (primary) hypertension: Secondary | ICD-10-CM | POA: Diagnosis not present

## 2023-09-06 DIAGNOSIS — I10 Essential (primary) hypertension: Secondary | ICD-10-CM | POA: Diagnosis not present

## 2023-09-06 DIAGNOSIS — N1831 Chronic kidney disease, stage 3a: Secondary | ICD-10-CM | POA: Diagnosis not present

## 2023-09-07 ENCOUNTER — Ambulatory Visit (HOSPITAL_COMMUNITY)
Admission: RE | Admit: 2023-09-07 | Discharge: 2023-09-07 | Disposition: A | Discharge status: Home / Self Care | Payer: Medicare Other | Source: Ambulatory Visit | Attending: Gerontology | Admitting: Gerontology

## 2023-09-07 DIAGNOSIS — Z87891 Personal history of nicotine dependence: Secondary | ICD-10-CM | POA: Diagnosis not present

## 2023-09-07 DIAGNOSIS — D7589 Other specified diseases of blood and blood-forming organs: Secondary | ICD-10-CM | POA: Diagnosis not present

## 2023-09-07 DIAGNOSIS — R809 Proteinuria, unspecified: Secondary | ICD-10-CM | POA: Diagnosis not present

## 2023-09-07 DIAGNOSIS — D539 Nutritional anemia, unspecified: Secondary | ICD-10-CM | POA: Diagnosis not present

## 2023-09-07 DIAGNOSIS — N189 Chronic kidney disease, unspecified: Secondary | ICD-10-CM | POA: Diagnosis not present

## 2023-09-07 DIAGNOSIS — D631 Anemia in chronic kidney disease: Secondary | ICD-10-CM | POA: Diagnosis not present

## 2023-09-07 DIAGNOSIS — F17210 Nicotine dependence, cigarettes, uncomplicated: Secondary | ICD-10-CM | POA: Diagnosis not present

## 2023-09-14 DIAGNOSIS — N1831 Chronic kidney disease, stage 3a: Secondary | ICD-10-CM | POA: Diagnosis not present

## 2023-09-14 DIAGNOSIS — E559 Vitamin D deficiency, unspecified: Secondary | ICD-10-CM | POA: Diagnosis not present

## 2023-09-14 DIAGNOSIS — R809 Proteinuria, unspecified: Secondary | ICD-10-CM | POA: Diagnosis not present

## 2023-09-14 DIAGNOSIS — I129 Hypertensive chronic kidney disease with stage 1 through stage 4 chronic kidney disease, or unspecified chronic kidney disease: Secondary | ICD-10-CM | POA: Diagnosis not present

## 2023-10-07 DIAGNOSIS — N1831 Chronic kidney disease, stage 3a: Secondary | ICD-10-CM | POA: Diagnosis not present

## 2023-10-07 DIAGNOSIS — I10 Essential (primary) hypertension: Secondary | ICD-10-CM | POA: Diagnosis not present

## 2023-11-06 DIAGNOSIS — I10 Essential (primary) hypertension: Secondary | ICD-10-CM | POA: Diagnosis not present

## 2023-11-06 DIAGNOSIS — N1831 Chronic kidney disease, stage 3a: Secondary | ICD-10-CM | POA: Diagnosis not present

## 2024-01-02 DIAGNOSIS — J449 Chronic obstructive pulmonary disease, unspecified: Secondary | ICD-10-CM | POA: Diagnosis not present

## 2024-01-02 DIAGNOSIS — N1831 Chronic kidney disease, stage 3a: Secondary | ICD-10-CM | POA: Diagnosis not present

## 2024-01-02 DIAGNOSIS — M109 Gout, unspecified: Secondary | ICD-10-CM | POA: Diagnosis not present

## 2024-01-02 DIAGNOSIS — I10 Essential (primary) hypertension: Secondary | ICD-10-CM | POA: Diagnosis not present

## 2024-02-02 DIAGNOSIS — I10 Essential (primary) hypertension: Secondary | ICD-10-CM | POA: Diagnosis not present

## 2024-02-02 DIAGNOSIS — N1831 Chronic kidney disease, stage 3a: Secondary | ICD-10-CM | POA: Diagnosis not present

## 2024-03-04 DIAGNOSIS — N1831 Chronic kidney disease, stage 3a: Secondary | ICD-10-CM | POA: Diagnosis not present

## 2024-03-04 DIAGNOSIS — I10 Essential (primary) hypertension: Secondary | ICD-10-CM | POA: Diagnosis not present

## 2024-03-07 DIAGNOSIS — E211 Secondary hyperparathyroidism, not elsewhere classified: Secondary | ICD-10-CM | POA: Diagnosis not present

## 2024-03-07 DIAGNOSIS — R809 Proteinuria, unspecified: Secondary | ICD-10-CM | POA: Diagnosis not present

## 2024-03-07 DIAGNOSIS — D631 Anemia in chronic kidney disease: Secondary | ICD-10-CM | POA: Diagnosis not present

## 2024-03-14 DIAGNOSIS — D7589 Other specified diseases of blood and blood-forming organs: Secondary | ICD-10-CM | POA: Diagnosis not present

## 2024-03-14 DIAGNOSIS — N179 Acute kidney failure, unspecified: Secondary | ICD-10-CM | POA: Diagnosis not present

## 2024-03-14 DIAGNOSIS — R809 Proteinuria, unspecified: Secondary | ICD-10-CM | POA: Diagnosis not present

## 2024-03-14 DIAGNOSIS — N1831 Chronic kidney disease, stage 3a: Secondary | ICD-10-CM | POA: Diagnosis not present

## 2024-03-19 ENCOUNTER — Other Ambulatory Visit (HOSPITAL_COMMUNITY): Payer: Self-pay | Admitting: Nephrology

## 2024-03-19 DIAGNOSIS — N1831 Chronic kidney disease, stage 3a: Secondary | ICD-10-CM

## 2024-03-19 DIAGNOSIS — R809 Proteinuria, unspecified: Secondary | ICD-10-CM

## 2024-03-19 DIAGNOSIS — I129 Hypertensive chronic kidney disease with stage 1 through stage 4 chronic kidney disease, or unspecified chronic kidney disease: Secondary | ICD-10-CM

## 2024-03-21 ENCOUNTER — Ambulatory Visit (HOSPITAL_COMMUNITY)
Admission: RE | Admit: 2024-03-21 | Discharge: 2024-03-21 | Disposition: A | Discharge status: Home / Self Care | Source: Ambulatory Visit | Attending: Nephrology | Admitting: Nephrology

## 2024-03-21 DIAGNOSIS — N1831 Chronic kidney disease, stage 3a: Secondary | ICD-10-CM | POA: Insufficient documentation

## 2024-03-21 DIAGNOSIS — I129 Hypertensive chronic kidney disease with stage 1 through stage 4 chronic kidney disease, or unspecified chronic kidney disease: Secondary | ICD-10-CM | POA: Insufficient documentation

## 2024-03-21 DIAGNOSIS — N189 Chronic kidney disease, unspecified: Secondary | ICD-10-CM | POA: Diagnosis not present

## 2024-03-21 DIAGNOSIS — N281 Cyst of kidney, acquired: Secondary | ICD-10-CM | POA: Diagnosis not present

## 2024-03-21 DIAGNOSIS — R809 Proteinuria, unspecified: Secondary | ICD-10-CM | POA: Insufficient documentation

## 2024-04-03 DIAGNOSIS — N1831 Chronic kidney disease, stage 3a: Secondary | ICD-10-CM | POA: Diagnosis not present

## 2024-04-03 DIAGNOSIS — I10 Essential (primary) hypertension: Secondary | ICD-10-CM | POA: Diagnosis not present

## 2024-07-22 ENCOUNTER — Other Ambulatory Visit (HOSPITAL_COMMUNITY): Payer: Self-pay | Admitting: Gerontology

## 2024-07-22 ENCOUNTER — Encounter (HOSPITAL_COMMUNITY): Payer: Self-pay | Admitting: Gerontology

## 2024-07-22 DIAGNOSIS — Z87891 Personal history of nicotine dependence: Secondary | ICD-10-CM
